# Patient Record
Sex: Male | Born: 1980
Health system: Southern US, Community
[De-identification: ages and names within clinical notes are randomized; demographics above are authoritative.]

## PROBLEM LIST (undated history)

## (undated) DIAGNOSIS — T7840XA Allergy, unspecified, initial encounter: Secondary | ICD-10-CM

## (undated) DIAGNOSIS — R55 Syncope and collapse: Secondary | ICD-10-CM

## (undated) DIAGNOSIS — J45909 Unspecified asthma, uncomplicated: Secondary | ICD-10-CM

## (undated) HISTORY — DX: Syncope and collapse: R55

## (undated) HISTORY — DX: Allergy, unspecified, initial encounter: T78.40XA

## (undated) HISTORY — DX: Unspecified asthma, uncomplicated: J45.909

## (undated) HISTORY — PX: ANKLE SURGERY: SHX546

---

## 2004-11-14 ENCOUNTER — Ambulatory Visit (HOSPITAL_COMMUNITY): Admission: RE | Admit: 2004-11-14 | Discharge: 2004-11-14 | Payer: Self-pay | Admitting: Family Medicine

## 2005-04-01 ENCOUNTER — Ambulatory Visit: Admission: RE | Admit: 2005-04-01 | Discharge: 2005-04-01 | Payer: Self-pay | Admitting: Family Medicine

## 2017-06-05 ENCOUNTER — Other Ambulatory Visit: Payer: Self-pay | Admitting: Family Medicine

## 2017-06-05 DIAGNOSIS — M25572 Pain in left ankle and joints of left foot: Secondary | ICD-10-CM

## 2017-06-10 ENCOUNTER — Other Ambulatory Visit: Payer: Self-pay

## 2017-06-18 ENCOUNTER — Ambulatory Visit
Admission: RE | Admit: 2017-06-18 | Discharge: 2017-06-18 | Disposition: A | Discharge status: Home / Self Care | Payer: Commercial Managed Care - HMO | Source: Ambulatory Visit | Attending: Family Medicine | Admitting: Family Medicine

## 2017-06-18 DIAGNOSIS — M25572 Pain in left ankle and joints of left foot: Secondary | ICD-10-CM

## 2017-09-21 ENCOUNTER — Ambulatory Visit: Payer: Commercial Managed Care - HMO | Admitting: Physical Therapy

## 2017-09-23 ENCOUNTER — Ambulatory Visit: Payer: 59 | Attending: Orthopedic Surgery | Admitting: Physical Therapy

## 2017-09-23 DIAGNOSIS — R600 Localized edema: Secondary | ICD-10-CM | POA: Diagnosis present

## 2017-09-23 DIAGNOSIS — M25672 Stiffness of left ankle, not elsewhere classified: Secondary | ICD-10-CM | POA: Diagnosis present

## 2017-09-23 DIAGNOSIS — M25572 Pain in left ankle and joints of left foot: Secondary | ICD-10-CM | POA: Diagnosis present

## 2017-09-23 NOTE — Therapy (Signed)
[facility] [address] [place], [place], [address] Phone: [phone]   Fax:  [phone]  Physical Therapy Evaluation  Patient Details  Name: James Sosa MRN: [id number] Date of Birth: 1981-11-11 Referring Provider: Alfredo MartinezJustin Ollis PA-C.  Encounter Date: 09/23/2017      PT End of Session - 09/23/17 1650    Visit Number 1   Number of Visits 12   Date for PT Re-Evaluation 10/21/17   PT Start Time 1203   PT Stop Time 1258   PT Time Calculation (min) 55 min   Activity Tolerance Patient tolerated treatment well   Behavior During Therapy [facility] for tasks assessed/performed      No past medical history on file.  No past surgical history on file.  There were no vitals filed for this visit.       Subjective Assessment - 09/23/17 1653    Subjective The patient reports that on May 30, 2017 he pased out likely due to Vasovagal syncope.  This resulted in a fall from a second story balcony and he sustained a left tibial fracture and underwent an ORIF. He is now wbat out of the boot.  His pain is a low 2/10 with some left heel pain.  His pain increases when standing too long and decreases with Aleve.               Limitations Standing   How long can you stand comfortably? 15 minutes.   Patient Stated Goals Get out of pain and walk normally.   Currently in Pain? Yes   Pain Score 2    Pain Location Ankle   Pain Orientation Left   Pain Descriptors / Indicators Aching;Dull   Pain Type Surgical pain   Pain Onset More than a month ago   Pain Frequency Intermittent   Aggravating Factors  See above.   Pain Relieving Factors See above.            [facility] - 09/23/17 0001      Assessment   Medical Diagnosis Left minimally displaced left tibial fracture.   Referring Provider Alfredo MartinezJustin Ollis PA-C.   Onset Date/Surgical Date --  05/30/17 (date of injury).     Precautions   Precautions None     Restrictions   Weight Bearing  Restrictions No     Balance Screen   Has the patient fallen in the past 6 months Yes   How many times? --  1.   Has the patient had a decrease in activity level because of a fear of falling?  No   Is the patient reluctant to leave their home because of a fear of falling?  No     Home Environment   Living Environment Private residence     Prior Function   Level of Independence Independent     Observation/Other Assessments   Observations Left anterior distal tibial incision is well healed.     Observation/Other Assessments-Edema    Edema --  Min localized edema in area of malleoli.     ROM / Strength   AROM / PROM / Strength AROM;Strength     AROM   AROM Assessment Site Ankle   Right/Left Ankle Left   Left Ankle Dorsiflexion --  3 degrees knee extended and 10 degrees knee flexed.   Left Ankle Plantar Flexion --  30 degrees.   Left Ankle Inversion --  10 degrees.   Left Ankle Eversion --  4 degrees.  Strength   Overall Strength Comments Graded only groslly at 4/5.     Palpation   Palpation comment Palpable pain over left heel and left distal tibia incisional site.     Ambulation/Gait   Gait Comments Decreased stance time over left LE.            Objective measurements completed on examination: See above findings.          [facility] Adult PT Treatment/Exercise - 09/23/17 0001      Modalities   Modalities Electrical Stimulation;Vasopneumatic     Electrical Stimulation   Electrical Stimulation Location Left foot/ankle.   Electrical Stimulation Action IFC   Electrical Stimulation Parameters 80-150 Hz x 20 minutes.   Electrical Stimulation Goals Edema;Pain     Vasopneumatic   Number Minutes Vasopneumatic  20 minutes   Vasopnuematic Location  --  Left ankle.   Vasopneumatic Pressure Medium                     PT Long Term Goals - 09/23/17 1728      PT LONG TERM GOAL #1   Title Independent with a HEP.   Time 4   Period Weeks    Status New     PT LONG TERM GOAL #2   Title Increase left ankle dorsiflexion to 8 degrees to normalize the patient's gait pattern.   Time 4   Period Weeks   Status New     PT LONG TERM GOAL #3   Title Increase left ankle strength to to 5/5 to increase stability for functional tasks.   Time 4   Period Weeks   Status New     PT LONG TERM GOAL #4   Title Walk a community distance with pain not > 2/10 and no deviations.   Time 4   Period Weeks   Status New                Plan - 09/23/17 1708    Clinical Impression Statement The patient presents to OPPT s/p left tibial ORIF.  He has been wbat over his left LE with normal footwear.  His pain is low but increases with increased standing.  He has some residual swelling and an expected loss of left ankle ROM and strength.  The patient's deficts impair his functional mobility but he is expected to do well with skilled physical therapy intervention.     History and Personal Factors relevant to plan of care: H/o "passing out".  Patient states it may be relate to a couple of head injuries in the past.   Clinical Presentation Stable   Clinical Presentation due to: Excellent surgicla outcome.   Clinical Decision Making Low   Rehab Potential Excellent   PT Frequency 3x / week   PT Duration 4 weeks   PT Treatment/Interventions ADLs/Self Care Home Management;Cryotherapy;Retail banker;Therapeutic activities;Therapeutic exercise;Ultrasound;Neuromuscular re-education;Patient/family education;Passive range of motion;Manual techniques;Vasopneumatic Device   PT Next Visit Plan Towel stretch to increase left ankle dorsiflexion; seated progressing to standing Rockerboard; PROM to increase all left ankle motion; Theraband exercises; progression to ankle isolator and dynadisc then to proproceptive drills.  E'stim and vasopneumatic.   Consulted and Agree with Plan of Care Patient      Patient will benefit from  skilled therapeutic intervention in order to improve the following deficits and impairments:  Abnormal gait, Decreased activity tolerance, Pain, Decreased range of motion, Increased edema, Decreased strength, Decreased mobility  Visit Diagnosis: Pain in left ankle  and joints of left foot - Plan: PT plan of care cert/re-cert  Stiffness of left ankle, not elsewhere classified - Plan: PT plan of care cert/re-cert  Localized edema - Plan: PT plan of care cert/re-cert     Problem List There are no active problems to display for this patient.   James Sosa, [place] MPT 09/23/2017, 5:32 PM  [facility] [address] [place], [place], [address] Phone: [phone]   Fax:  [phone]  Name: James Sosa MRN: [id number] Date of Birth: Jul 09, 1981

## 2017-09-30 ENCOUNTER — Ambulatory Visit: Payer: 59 | Attending: Orthopedic Surgery | Admitting: Physical Therapy

## 2017-09-30 ENCOUNTER — Encounter: Payer: Self-pay | Admitting: Physical Therapy

## 2017-09-30 DIAGNOSIS — R600 Localized edema: Secondary | ICD-10-CM | POA: Diagnosis present

## 2017-09-30 DIAGNOSIS — M25672 Stiffness of left ankle, not elsewhere classified: Secondary | ICD-10-CM | POA: Insufficient documentation

## 2017-09-30 DIAGNOSIS — M25572 Pain in left ankle and joints of left foot: Secondary | ICD-10-CM | POA: Diagnosis not present

## 2017-09-30 NOTE — Therapy (Signed)
[facility] [address] [place], [place], [address] Phone: [phone]   Fax:  [phone]  Physical Therapy Treatment  Patient Details  Name: James RelicJeremy H Mccollum MRN: [id number] Date of Birth: 08/17/1981 Referring Provider: Alfredo MartinezJustin Ollis PA-C.   Encounter Date: 09/30/2017  PT End of Session - 09/30/17 1425    Visit Number  2    Number of Visits  12    Date for PT Re-Evaluation  10/21/17    PT Start Time  0945    PT Stop Time  1042    PT Time Calculation (min)  57 min       History reviewed. No pertinent past medical history.  History reviewed. No pertinent surgical history.  There were no vitals filed for this visit.  Subjective Assessment - 09/30/17 1431    Subjective  No new complaints.    Pain Score  2     Pain Location  Ankle    Pain Orientation  Left    Pain Descriptors / Indicators  Aching;Dull    Pain Type  Surgical pain    Pain Onset  More than a month ago                      [facility]/Exercise - 09/30/17 0001      Exercises   Exercises  Knee/Hip;Ankle      Knee/Hip Exercises: Aerobic   Nustep  Level 3 x 16 minutes.      Programme researcher, broadcasting/film/videolectrical Stimulation   Electrical Stimulation Location  Left foot/ankle.    Electrical Stimulation Action  IFC    Electrical Stimulation Parameters  80-150 Hz x 20 minutes.      Vasopneumatic   Number Minutes Vasopneumatic   20 minutes    Vasopnuematic Location   -- Left ankle.   Left ankle.   Vasopneumatic Pressure  Medium      Ankle Exercises: Standing   Rocker Board Limitations  5 minutes in parallel bars x 5 minutes.    Other Standing Ankle Exercises  Dynadisc x 5 minutes into dorsi/plantarflexion                  PT Long Term Goals - 09/23/17 1728      PT LONG TERM GOAL #1   Title  Independent with a HEP.    Time  4    Period  Weeks    Status  New      PT LONG TERM GOAL #2   Title  Increase left ankle dorsiflexion to 8 degrees to  normalize the patient's gait pattern.    Time  4    Period  Weeks    Status  New      PT LONG TERM GOAL #3   Title  Increase left ankle strength to to 5/5 to increase stability for functional tasks.    Time  4    Period  Weeks    Status  New      PT LONG TERM GOAL #4   Title  Walk a community distance with pain not > 2/10 and no deviations.    Time  4    Period  Weeks    Status  New            Plan - 09/30/17 1455    Clinical Impression Statement  Excellent job today.  No complaints with ther ex.    PT Next Visit Plan  Towel stretch to increase left ankle  dorsiflexion; seated progressing to standing Rockerboard; PROM to increase all left ankle motion; Theraband exercises; progression to ankle isolator and dynadisc then to proproceptive drills.  E'stim and vasopneumatic.       Patient will benefit from skilled therapeutic intervention in order to improve the following deficits and impairments:  Abnormal gait, Decreased activity tolerance, Pain, Decreased range of motion, Increased edema, Decreased strength, Decreased mobility  Visit Diagnosis: Pain in left ankle and joints of left foot  Stiffness of left ankle, not elsewhere classified  Localized edema     Problem List There are no active problems to display for this patient.   Chaz Ronning, [place]  MPT 09/30/2017, 3:02 PM  [facility] [address] [place], [place], [address] Phone: [phone]   Fax:  [phone]  Name: James RelicJeremy H Wooley MRN: [id number] Date of Birth: 10-24-1981

## 2017-10-02 ENCOUNTER — Ambulatory Visit: Payer: 59 | Admitting: Physical Therapy

## 2017-10-02 ENCOUNTER — Encounter: Payer: Self-pay | Admitting: Physical Therapy

## 2017-10-02 DIAGNOSIS — M25572 Pain in left ankle and joints of left foot: Secondary | ICD-10-CM | POA: Diagnosis not present

## 2017-10-02 DIAGNOSIS — M25672 Stiffness of left ankle, not elsewhere classified: Secondary | ICD-10-CM

## 2017-10-02 DIAGNOSIS — R600 Localized edema: Secondary | ICD-10-CM

## 2017-10-02 NOTE — Therapy (Signed)
[facility] [address] [place], [place], [address] Phone: [phone]   Fax:  [phone]  Physical Therapy Treatment  Patient Details  Name: James Sosa MRN: [id number] Date of Birth: 11-Jun-1981 Referring Provider: Alfredo MartinezJustin Ollis PA-C.   Encounter Date: 10/02/2017  PT End of Session - 10/02/17 0954    Visit Number  3    Number of Visits  12    Date for PT Re-Evaluation  10/21/17    PT Start Time  0951    PT Stop Time  1042    PT Time Calculation (min)  51 min    Activity Tolerance  Patient tolerated treatment well    Behavior During Therapy  [facility] for tasks assessed/performed       History reviewed. No pertinent past medical history.  History reviewed. No pertinent surgical history.  There were no vitals filed for this visit.  Subjective Assessment - 10/02/17 0953    Subjective  Reports pain at this time is nothing that tylenol can't fix.    Limitations  Standing    How long can you stand comfortably?  15 minutes.    Patient Stated Goals  Get out of pain and walk normally.    Currently in Pain?  No/denies         [facility] - 10/02/17 0001      Assessment   Medical Diagnosis  Left minimally displaced left tibial fracture.    Onset Date/Surgical Date  05/30/17 DOI    Next MD Visit  10/09/2017      Precautions   Precautions  None      Restrictions   Weight Bearing Restrictions  No                  [facility] Adult PT Treatment/Exercise - 10/02/17 0001      Knee/Hip Exercises: Aerobic   Stationary Bike  L1 x10 min      Knee/Hip Exercises: Standing   Heel Raises  Both;20 reps B toe raise x20 reps    Rocker Board  3 minutes x3 min for stretch, x3 min for balance    Other Standing Knee Exercises  L ankle dynadisc PF/DF, Inv/Ev, circles       Modalities   Modalities  Electrical Stimulation;Vasopneumatic      Electrical Stimulation   Electrical Stimulation Location  L ankle    Electrical  Stimulation Action  Pre-Mod    Electrical Stimulation Parameters  80-150 hz x15 min    Electrical Stimulation Goals  Edema;Pain      Vasopneumatic   Number Minutes Vasopneumatic   15 minutes    Vasopnuematic Location   Knee    Vasopneumatic Pressure  Medium    Vasopneumatic Temperature   53      Ankle Exercises: Seated   BAPS  Sitting;Level 2;15 reps PF/DF, Inv/Ev    Other Seated Ankle Exercises  L ankle dynadisc 1# DF/Inv/Ev x20 reps each                  PT Long Term Goals - 09/23/17 1728      PT LONG TERM GOAL #1   Title  Independent with a HEP.    Time  4    Period  Weeks    Status  New      PT LONG TERM GOAL #2   Title  Increase left ankle dorsiflexion to 8 degrees to normalize the patient's gait pattern.    Time  4  Period  Weeks    Status  New      PT LONG TERM GOAL #3   Title  Increase left ankle strength to to 5/5 to increase stability for functional tasks.    Time  4    Period  Weeks    Status  New      PT LONG TERM GOAL #4   Title  Walk a community distance with pain not > 2/10 and no deviations.    Time  4    Period  Weeks    Status  New            Plan - 10/02/17 1042    Clinical Impression Statement  Patient tolerated today's treatment well as he arrived with no L ankle pain and had no complaints of L ankle pain during exercises. Patient experienced discomfort with L ankle inversion today per patient report. Patient lacked full L ankle control with seated BAPS board today in all directions assessed. Minimal edema present surrounding lateral L malleoli upon obesrvation. Normal modalities response noted following removal of the modalities.    Rehab Potential  Excellent    PT Frequency  3x / week    PT Duration  4 weeks    PT Treatment/Interventions  ADLs/Self Care Home Management;Cryotherapy;Retail bankerlectrical Stimulation;Gait training;Stair training;Therapeutic activities;Therapeutic exercise;Ultrasound;Neuromuscular re-education;Patient/family  education;Passive range of motion;Manual techniques;Vasopneumatic Device    PT Next Visit Plan  Continue with ankle strengthening as well as control exercises per MPT POC.    Consulted and Agree with Plan of Care  Patient       Patient will benefit from skilled therapeutic intervention in order to improve the following deficits and impairments:  Abnormal gait, Decreased activity tolerance, Pain, Decreased range of motion, Increased edema, Decreased strength, Decreased mobility  Visit Diagnosis: Pain in left ankle and joints of left foot  Stiffness of left ankle, not elsewhere classified  Localized edema     Problem List There are no active problems to display for this patient.   Evelene CroonKelsey M Parsons, PTA 10/02/2017, 10:51 AM  [facility] [address] [place], [place], [address] Phone: [phone]   Fax:  [phone]  Name: James Sosa MRN: [id number] Date of Birth: Dec 07, 1980

## 2017-10-07 ENCOUNTER — Encounter: Payer: Self-pay | Admitting: Physical Therapy

## 2017-10-07 ENCOUNTER — Ambulatory Visit: Payer: 59 | Admitting: Physical Therapy

## 2017-10-07 DIAGNOSIS — M25572 Pain in left ankle and joints of left foot: Secondary | ICD-10-CM

## 2017-10-07 DIAGNOSIS — M25672 Stiffness of left ankle, not elsewhere classified: Secondary | ICD-10-CM

## 2017-10-07 DIAGNOSIS — R600 Localized edema: Secondary | ICD-10-CM

## 2017-10-07 NOTE — Therapy (Signed)
[facility] [address] [place], [place], [address] Phone: [phone]   Fax:  [phone]  Physical Therapy Treatment  Patient Details  Name: James Sosa MRN: [id number] Date of Birth: 1980/12/08 Referring Provider: Alfredo MartinezJustin Ollis PA-C.   Encounter Date: 10/07/2017  PT End of Session - 10/07/17 1037    Visit Number  4    Number of Visits  12    Date for PT Re-Evaluation  10/21/17    PT Start Time  0951    PT Stop Time  1045    PT Time Calculation (min)  54 min    Activity Tolerance  Patient tolerated treatment well    Behavior During Therapy  [facility] for tasks assessed/performed       History reviewed. No pertinent past medical history.  History reviewed. No pertinent surgical history.  There were no vitals filed for this visit.  Subjective Assessment - 10/07/17 0953    Subjective  No complaints upon arrival    Limitations  Standing    How long can you stand comfortably?  15 minutes.    Patient Stated Goals  Get out of pain and walk normally.    Currently in Pain?  No/denies         [facility] - 10/07/17 0001      AROM   AROM Assessment Site  Ankle    Right/Left Ankle  Left    Left Ankle Dorsiflexion  5                  [facility] Adult PT Treatment/Exercise - 10/07/17 0001      Knee/Hip Exercises: Aerobic   Stationary Bike  L1 x14 min      Knee/Hip Exercises: Standing   Heel Raises  Both;20 reps    Rocker Board  Other (comment) 3min each balance/stretch      Programme researcher, broadcasting/film/videolectrical Stimulation   Electrical Stimulation Location  L ankle    Electrical Stimulation Action  premod    Electrical Stimulation Parameters  1-10hz  x7315min    Electrical Stimulation Goals  Edema;Pain      Vasopneumatic   Number Minutes Vasopneumatic   15 minutes    Vasopnuematic Location   Knee    Vasopneumatic Pressure  Medium      Ankle Exercises: Seated   Other Seated Ankle Exercises  L ankle dynadisc 1# DF (seaed) Inv/Ev  (sidelying) x20 reps each    Other Seated Ankle Exercises  3min seated prostretch      Ankle Exercises: Supine   T-Band  yellow x20 each way                  PT Long Term Goals - 10/07/17 0954      PT LONG TERM GOAL #1   Title  Independent with a HEP.    Time  4    Period  Weeks    Status  On-going      PT LONG TERM GOAL #2   Title  Increase left ankle dorsiflexion to 8 degrees to normalize the patient's gait pattern.    Time  4    Period  Weeks    Status  On-going      PT LONG TERM GOAL #3   Title  Increase left ankle strength to to 5/5 to increase stability for functional tasks.    Time  4    Period  Weeks    Status  On-going      PT LONG TERM  GOAL #4   Title  Walk a community distance with pain not > 2/10 and no deviations.    Time  4    Period  Weeks    Status  On-going            Plan - 10/07/17 1040    Clinical Impression Statement  Patient tolerated treatment well today. Patient has reported little to no discomfort and continues to wear compression sock to help with swelling. Patient able to progress with exercises today with no difficulty. Patient has improved AROM for DF today. Progressing toward goals.     Rehab Potential  Excellent    PT Frequency  3x / week    PT Duration  4 weeks    PT Treatment/Interventions  ADLs/Self Care Home Management;Cryotherapy;Retail bankerlectrical Stimulation;Gait training;Stair training;Therapeutic activities;Therapeutic exercise;Ultrasound;Neuromuscular re-education;Patient/family education;Passive range of motion;Manual techniques;Vasopneumatic Device    PT Next Visit Plan  Continue with ankle strengthening as well as control exercises per MPT POC. MD. note Victorino Dike(Hewitt) next treatment and issue HEP    Consulted and Agree with Plan of Care  Patient       Patient will benefit from skilled therapeutic intervention in order to improve the following deficits and impairments:  Abnormal gait, Decreased activity tolerance, Pain,  Decreased range of motion, Increased edema, Decreased strength, Decreased mobility  Visit Diagnosis: Pain in left ankle and joints of left foot  Stiffness of left ankle, not elsewhere classified  Localized edema     Problem List There are no active problems to display for this patient.   Hermelinda DellenDUNFORD, Miklo Aken P, PTA 10/07/2017, 10:53 AM  [facility] [address] [place], [place], [address] Phone: [phone]   Fax:  [phone]  Name: James Sosa MRN: [id number] Date of Birth: 1981-06-27

## 2017-10-08 ENCOUNTER — Encounter: Payer: Self-pay | Admitting: Physical Therapy

## 2017-10-08 ENCOUNTER — Ambulatory Visit: Payer: 59 | Admitting: Physical Therapy

## 2017-10-08 DIAGNOSIS — M25572 Pain in left ankle and joints of left foot: Secondary | ICD-10-CM | POA: Diagnosis not present

## 2017-10-08 DIAGNOSIS — M25672 Stiffness of left ankle, not elsewhere classified: Secondary | ICD-10-CM

## 2017-10-08 DIAGNOSIS — R600 Localized edema: Secondary | ICD-10-CM

## 2017-10-08 NOTE — Therapy (Signed)
[facility] [address] [place], [place], [address] Phone: [phone]   Fax:  [phone]  Physical Therapy Treatment  Patient Details  Name: James Sosa MRN: [id number] Date of Birth: 21-Apr-1981 Referring Provider: Alfredo MartinezJustin Ollis PA-C.   Encounter Date: 10/08/2017  PT End of Session - 10/08/17 1115    Visit Number  5    Number of Visits  12    Date for PT Re-Evaluation  10/21/17    PT Start Time  1030    PT Stop Time  1124    PT Time Calculation (min)  54 min    Activity Tolerance  Patient tolerated treatment well    Behavior During Therapy  [facility] for tasks assessed/performed       History reviewed. No pertinent past medical history.  History reviewed. No pertinent surgical history.  There were no vitals filed for this visit.  Subjective Assessment - 10/08/17 1033    Subjective  Patient reported doing well overall and would like to go back to work/MD appt tomorrow    Limitations  Standing    How long can you stand comfortably?  15 minutes.    Patient Stated Goals  Get out of pain and walk normally.    Currently in Pain?  No/denies         [facility] - 10/08/17 0001      AROM   AROM Assessment Site  Ankle    Right/Left Ankle  Left    Left Ankle Dorsiflexion  5                  [facility] Adult PT Treatment/Exercise - 10/08/17 0001      Knee/Hip Exercises: Aerobic   Stationary Bike  x11 min L5      Knee/Hip Exercises: Standing   Heel Raises  Both;20 reps    Rocker Board  Other (comment) balance/stretch      Knee/Hip Exercises: Supine   Straight Leg Raises  Strengthening;Left;3 sets;10 reps      Knee/Hip Exercises: Sidelying   Hip ABduction  Strengthening;Left;20 reps      Electrical Stimulation   Electrical Stimulation Location  L ankle    Electrical Stimulation Action  premod    Electrical Stimulation Parameters  1-10hz  x115min    Electrical Stimulation Goals  Edema;Pain      Vasopneumatic   Number Minutes Vasopneumatic   15 minutes    Vasopnuematic Location   Knee    Vasopneumatic Pressure  Medium      Ankle Exercises: Seated   Other Seated Ankle Exercises  L ankle dynadisc 1# DF (seated) Inv/Ev (sidelying) x30 reps each    Other Seated Ankle Exercises  3min seated prostretch      Ankle Exercises: Supine   T-Band  red x20 each way             PT Education - 10/08/17 1041    Education provided  Yes    Education Details  HEP    Person(s) Educated  Patient    Methods  Explanation;Demonstration;Handout    Comprehension  Verbalized understanding;Returned demonstration          PT Long Term Goals - 10/07/17 0954      PT LONG TERM GOAL #1   Title  Independent with a HEP.    Time  4    Period  Weeks    Status  On-going      PT LONG TERM GOAL #2   Title  Increase left ankle dorsiflexion to 8 degrees to normalize the patient's gait pattern.    Time  4    Period  Weeks    Status  On-going      PT LONG TERM GOAL #3   Title  Increase left ankle strength to to 5/5 to increase stability for functional tasks.    Time  4    Period  Weeks    Status  On-going      PT LONG TERM GOAL #4   Title  Walk a community distance with pain not > 2/10 and no deviations.    Time  4    Period  Weeks    Status  On-going            Plan - 10/08/17 1115    Clinical Impression Statement  Patient tolerated treatment well today. Patient reported little to no discomfort overall and continues to wear compression sock to help with edema. Patient progressing with exercises today. Patient improved with AROM DF to 5 degrees. Goals progressing.     Rehab Potential  Excellent    PT Frequency  3x / week    PT Duration  4 weeks    PT Treatment/Interventions  ADLs/Self Care Home Management;Cryotherapy;Retail bankerlectrical Stimulation;Gait training;Stair training;Therapeutic activities;Therapeutic exercise;Ultrasound;Neuromuscular re-education;Patient/family education;Passive  range of motion;Manual techniques;Vasopneumatic Device    PT Next Visit Plan  Continue with ankle strengthening as well as control exercises per MPT POC. MD. note sent (Hewitt)     Consulted and Agree with Plan of Care  Patient       Patient will benefit from skilled therapeutic intervention in order to improve the following deficits and impairments:  Abnormal gait, Decreased activity tolerance, Pain, Decreased range of motion, Increased edema, Decreased strength, Decreased mobility  Visit Diagnosis: Pain in left ankle and joints of left foot  Stiffness of left ankle, not elsewhere classified  Localized edema     Problem List There are no active problems to display for this patient.   Cathie HoopsChristina Anahi Belmar, PTA 10/08/17 11:56 AM  [facility] [address] [place], [place], [address] Phone: [phone]   Fax:  [phone]  Name: James Sosa MRN: [id number] Date of Birth: 06/03/1981

## 2017-10-08 NOTE — Patient Instructions (Signed)
  Dorsiflexion: Resisted   Facing anchor, tubing around left foot, pull toward face.  Repeat _10___ times per set. Do __2__ sets per session. Do _2___ sessions per day.   Plantar Flexion: Resisted   Anchor behind, tubing around left foot, press down. Repeat __10__ times per set. Do __2__ sets per session. Do ___2_ sessions per day.   Inversion: Resisted   Cross legs with right leg underneath, foot in tubing loop. Hold tubing around other foot to resist and turn foot in. Repeat _10___ times per set. Do __2__ sets per session. Do _2___ sessions per day.   Eversion: Resisted   With right foot in tubing loop, hold tubing around other foot to resist and turn foot out. Repeat _10___ times per set. Do __2__ sets per session. Do __2__ sessions per day.    

## 2017-10-09 ENCOUNTER — Encounter: Payer: 59 | Admitting: *Deleted

## 2017-10-13 ENCOUNTER — Ambulatory Visit: Payer: 59 | Admitting: *Deleted

## 2017-10-13 DIAGNOSIS — M25672 Stiffness of left ankle, not elsewhere classified: Secondary | ICD-10-CM

## 2017-10-13 DIAGNOSIS — M25572 Pain in left ankle and joints of left foot: Secondary | ICD-10-CM | POA: Diagnosis not present

## 2017-10-13 NOTE — Therapy (Signed)
[facility] [address] [place], [place], [address] Phone: [phone]   Fax:  [phone]  Physical Therapy Treatment  Patient Details  Name: James RelicJeremy H Sosa MRN: [id number] Date of Birth: May 28, 1981 Referring Provider: Alfredo MartinezJustin Ollis PA-C.   Encounter Date: 10/13/2017  PT End of Session - 10/13/17 1000    Visit Number  6    Number of Visits  12    Date for PT Re-Evaluation  10/21/17    PT Start Time  0945    PT Stop Time  1044    PT Time Calculation (min)  59 min       No past medical history on file.  No past surgical history on file.  There were no vitals filed for this visit.  Subjective Assessment - 10/13/17 0958    Subjective  MD F/U went well. Released for work. Went BTW yesterday    Limitations  Standing    How long can you stand comfortably?  15 minutes.    Patient Stated Goals  Get out of pain and walk normally.    Currently in Pain?  No/denies    Pain Location  Ankle    Pain Orientation  Left    Pain Descriptors / Indicators  Sore    Pain Onset  More than a month ago    Pain Frequency  Intermittent                      [facility] Adult PT Treatment/Exercise - 10/13/17 0001      Knee/Hip Exercises: Aerobic   Stationary Bike  x15  min L5      Knee/Hip Exercises: Standing   Heel Raises  Both;20 reps    Rocker Board  Other (comment);5 minutes balance/stretch    SLS  LT LE balance in bars  5-8 secs max without assist.      Insurance claims handlerlectrical Stimulation   Electrical Stimulation Location  L ankle  premod 1-10hz  x 15 mins    Electrical Stimulation Goals  Edema;Pain      Vasopneumatic   Number Minutes Vasopneumatic   15 minutes    Vasopnuematic Location   Knee    Vasopneumatic Pressure  Medium    Vasopneumatic Temperature   36      Ankle Exercises: Standing   Other Standing Ankle Exercises  Dynadisc x 5 minutes into dorsi/plantarflexion, circles                  PT Long Term Goals -  10/07/17 0954      PT LONG TERM GOAL #1   Title  Independent with a HEP.    Time  4    Period  Weeks    Status  On-going      PT LONG TERM GOAL #2   Title  Increase left ankle dorsiflexion to 8 degrees to normalize the patient's gait pattern.    Time  4    Period  Weeks    Status  On-going      PT LONG TERM GOAL #3   Title  Increase left ankle strength to to 5/5 to increase stability for functional tasks.    Time  4    Period  Weeks    Status  On-going      PT LONG TERM GOAL #4   Title  Walk a community distance with pain not > 2/10 and no deviations.    Time  4    Period  Weeks  Status  On-going            Plan - 10/13/17 1101    Clinical Impression Statement  Pt did well with PT Rx and continues to progress. SLS was the most challenging and could only be held 5-8 secs without assistance.    PT Treatment/Interventions  Other (comment)    PT Next Visit Plan  Continue with ankle strengthening as well as control exercises per MPT POC. MD. note sent (Hewitt)     Consulted and Agree with Plan of Care  Patient       Patient will benefit from skilled therapeutic intervention in order to improve the following deficits and impairments:     Visit Diagnosis: Pain in left ankle and joints of left foot  Stiffness of left ankle, not elsewhere classified     Problem List There are no active problems to display for this patient.   James Sosa,CHRIS, PTA 10/13/2017, 6:11 PM  [facility] [address] [place], [place], [address] Phone: [phone]   Fax:  [phone]  Name: James RelicJeremy H Sosa MRN: [id number] Date of Birth: [birth year]

## 2017-10-20 ENCOUNTER — Encounter: Payer: Self-pay | Admitting: Physical Therapy

## 2017-10-20 ENCOUNTER — Ambulatory Visit: Payer: 59 | Admitting: Physical Therapy

## 2017-10-20 DIAGNOSIS — M25572 Pain in left ankle and joints of left foot: Secondary | ICD-10-CM | POA: Diagnosis not present

## 2017-10-20 DIAGNOSIS — R600 Localized edema: Secondary | ICD-10-CM

## 2017-10-20 DIAGNOSIS — M25672 Stiffness of left ankle, not elsewhere classified: Secondary | ICD-10-CM

## 2017-10-20 NOTE — Therapy (Signed)
[facility] [address] [place], [place], [address] Phone: [phone]   Fax:  [phone]  Physical Therapy Treatment  Patient Details  Name: James RelicJeremy H Sosa MRN: [id number] Date of Birth: Feb 01, 1981 Referring Provider: Alfredo MartinezJustin Ollis PA-C.   Encounter Date: 10/20/2017  PT End of Session - 10/20/17 0950    Visit Number  7    Number of Visits  12    Date for PT Re-Evaluation  10/21/17    PT Start Time  0949    PT Stop Time  1044    PT Time Calculation (min)  55 min    Activity Tolerance  Patient tolerated treatment well    Behavior During Therapy  [facility] for tasks assessed/performed       History reviewed. No pertinent past medical history.  History reviewed. No pertinent surgical history.  There were no vitals filed for this visit.  Subjective Assessment - 10/20/17 0950    Subjective  Reports that he has returned to work and has some soreness as he has to stand on concrete floors and does quite a bit of standing at work per patient report.    Limitations  Standing    How long can you stand comfortably?  6-7 hours    How long can you walk comfortably?  unlimited with flat surfaces    Patient Stated Goals  Get out of pain and walk normally.    Currently in Pain?  No/denies         [facility] - 10/20/17 0001      Assessment   Medical Diagnosis  Left minimally displaced left tibial fracture.    Onset Date/Surgical Date  05/30/17    Next MD Visit  None      Precautions   Precautions  None      Restrictions   Weight Bearing Restrictions  No                  [facility] Adult PT Treatment/Exercise - 10/20/17 0001      Knee/Hip Exercises: Aerobic   Stationary Bike  L3 x12 min      Modalities   Modalities  Programmer, applicationslectrical Stimulation;Vasopneumatic      Electrical Stimulation   Electrical Stimulation Location  L ankle    Electrical Stimulation Action  Pre-Mod    Electrical Stimulation Parameters  80-150 hz x15  min    Electrical Stimulation Goals  Pain      Vasopneumatic   Number Minutes Vasopneumatic   15 minutes    Vasopnuematic Location   Knee    Vasopneumatic Pressure  Medium    Vasopneumatic Temperature   66      Ankle Exercises: Standing   Rocker Board  3 minutes    Heel Raises  20 reps 3D off 2" step    Toe Raise  20 reps      Ankle Exercises: Sidelying   Ankle Inversion  Strengthening;Left;20 reps;Weights    Ankle Inversion Weights (lbs)  1    Ankle Eversion  Strengthening;Left;20 reps;Weights    Ankle Eversion Weights (lbs)  1      Ankle Exercises: Stretches   Soleus Stretch  3 reps;30 seconds          Balance Exercises - 10/20/17 1018      Balance Exercises: Standing   Standing Eyes Opened  Narrow base of support (BOS) BOSU DLS x2 min    Tandem Stance  Eyes open;Foam/compliant surface;Intermittent upper extremity support on beam x3 min  SLS  Eyes open;Foam/compliant surface;Intermittent upper extremity support x4 min             PT Long Term Goals - 10/20/17 1001      PT LONG TERM GOAL #1   Title  Independent with a HEP.    Time  4    Period  Weeks    Status  Achieved      PT LONG TERM GOAL #2   Title  Increase left ankle dorsiflexion to 8 degrees to normalize the patient's gait pattern.    Time  4    Period  Weeks    Status  On-going      PT LONG TERM GOAL #3   Title  Increase left ankle strength to to 5/5 to increase stability for functional tasks.    Time  4    Period  Weeks    Status  On-going      PT LONG TERM GOAL #4   Title  Walk a community distance with pain not > 2/10 and no deviations.    Time  4    Period  Weeks    Status  Achieved            Plan - 10/20/17 1041    Clinical Impression Statement  Patient tolerated today's treatment well although with SLS he reported calf fatigue. Patient able to tolerate progressive strengthening and balance activities today without complaint of pain. Patient able to achieve HEP and pain  with ambulation goal. Normal modalities response noted following removal of the modalities.    Rehab Potential  Excellent    PT Frequency  3x / week    PT Duration  4 weeks    PT Treatment/Interventions  Other (comment)    PT Next Visit Plan  Continue with ankle strengthening as well as control exercises per MPT POC. MD. note sent (Hewitt)     Consulted and Agree with Plan of Care  Patient       Patient will benefit from skilled therapeutic intervention in order to improve the following deficits and impairments:  Abnormal gait, Decreased activity tolerance, Pain, Decreased range of motion, Increased edema, Decreased strength, Decreased mobility  Visit Diagnosis: Pain in left ankle and joints of left foot  Stiffness of left ankle, not elsewhere classified  Localized edema     Problem List There are no active problems to display for this patient.   Marvell FullerKelsey P Kennon, PTA 10/20/2017, 11:01 AM  [facility] [address] [place], [place], [address] Phone: [phone]   Fax:  [phone]  Name: James RelicJeremy H Sosa MRN: [id number] Date of Birth: 02-13-1981

## 2017-10-22 ENCOUNTER — Ambulatory Visit: Payer: 59 | Admitting: Physical Therapy

## 2017-10-22 ENCOUNTER — Encounter: Payer: Self-pay | Admitting: Physical Therapy

## 2017-10-22 DIAGNOSIS — M25572 Pain in left ankle and joints of left foot: Secondary | ICD-10-CM

## 2017-10-22 DIAGNOSIS — M25672 Stiffness of left ankle, not elsewhere classified: Secondary | ICD-10-CM

## 2017-10-22 DIAGNOSIS — R600 Localized edema: Secondary | ICD-10-CM

## 2017-10-22 NOTE — Therapy (Signed)
[facility] [address] [place], [place], [address] Phone: [phone]   Fax:  [phone]  Physical Therapy Treatment  Patient Details  Name: James RelicJeremy H Hedglin MRN: [id number] Date of Birth: [birth year] Referring Provider: Alfredo MartinezJustin Ollis PA-C.   Encounter Date: 10/22/2017  PT End of Session - 10/22/17 0907    Visit Number  8    Number of Visits  12    Date for PT Re-Evaluation  10/21/17    PT Start Time  0904    PT Stop Time  1002    PT Time Calculation (min)  58 min    Activity Tolerance  Patient tolerated treatment well    Behavior During Therapy  [facility] for tasks assessed/performed       History reviewed. No pertinent past medical history.  History reviewed. No pertinent surgical history.  There were no vitals filed for this visit.  Subjective Assessment - 10/22/17 0907    Subjective  Reports calf soreness from previous treatment but no pain.    Limitations  Standing    How long can you stand comfortably?  6-7 hours    How long can you walk comfortably?  unlimited with flat surfaces    Patient Stated Goals  Get out of pain and walk normally.    Currently in Pain?  No/denies         [facility] - 10/22/17 0001      Assessment   Medical Diagnosis  Left minimally displaced left tibial fracture.    Onset Date/Surgical Date  05/30/17    Next MD Visit  None      Precautions   Precautions  None      Restrictions   Weight Bearing Restrictions  No                  [facility] Adult PT Treatment/Exercise - 10/22/17 0001      Ankle Exercises: Aerobic   Stationary Bike  L4 x14 min      Ankle Exercises: Stretches   Soleus Stretch  3 reps;20 seconds    Slant Board Stretch  3 reps;30 seconds      Ankle Exercises: Standing   Heel Raises  20 reps 3D on floor    Toe Raise  20 reps      Ankle Exercises: Sidelying   Ankle Inversion  Strengthening;Left;20 reps;Weights    Ankle Inversion Weights (lbs)  1    Ankle  Eversion  Strengthening;Left;20 reps;Weights    Ankle Eversion Weights (lbs)  1          Balance Exercises - 10/22/17 0932      Balance Exercises: Standing   Standing Eyes Opened  Narrow base of support (BOS);Foam/compliant surface x4 min    SLS  Eyes open;Foam/compliant surface;Solid surface;Intermittent upper extremity support x2 min SLS with balance pod touches; x3 m airex             PT Long Term Goals - 10/20/17 1001      PT LONG TERM GOAL #1   Title  Independent with a HEP.    Time  4    Period  Weeks    Status  Achieved      PT LONG TERM GOAL #2   Title  Increase left ankle dorsiflexion to 8 degrees to normalize the patient's gait pattern.    Time  4    Period  Weeks    Status  On-going      PT LONG TERM  GOAL #3   Title  Increase left ankle strength to to 5/5 to increase stability for functional tasks.    Time  4    Period  Weeks    Status  On-going      PT LONG TERM GOAL #4   Title  Walk a community distance with pain not > 2/10 and no deviations.    Time  4    Period  Weeks    Status  Achieved            Plan - 10/22/17 1009    Clinical Impression Statement  Patient tolerated today's treatment well with only reports of calf soreness. Patient able to complete more exercises in standing and SL for antigravity strengthening without complaint. 3D heel raises completed on floor secondary to soreness. SLS and uneven surface balance techniques completed again today with minimal instability and only intermitant UE support. Normal modalities response noted following removal of the modalities.    Rehab Potential  Excellent    PT Frequency  3x / week    PT Duration  4 weeks    PT Treatment/Interventions  Other (comment)    PT Next Visit Plan  Continue with ankle strengthening as well as control exercises per MPT POC. MD. note sent (Hewitt)     Consulted and Agree with Plan of Care  Patient       Patient will benefit from skilled therapeutic intervention  in order to improve the following deficits and impairments:  Abnormal gait, Decreased activity tolerance, Pain, Decreased range of motion, Increased edema, Decreased strength, Decreased mobility  Visit Diagnosis: Pain in left ankle and joints of left foot  Stiffness of left ankle, not elsewhere classified  Localized edema     Problem List There are no active problems to display for this patient.   Marvell FullerKelsey P Kennon, PTA 10/22/2017, 10:30 AM  [facility] [address] [place], [place], [address] Phone: [phone]   Fax:  [phone]  Name: James RelicJeremy H Cleland MRN: [id number] Date of Birth: Mar 07, 1981

## 2017-10-27 ENCOUNTER — Ambulatory Visit: Payer: 59 | Attending: Orthopedic Surgery | Admitting: Physical Therapy

## 2017-10-27 ENCOUNTER — Encounter: Payer: Self-pay | Admitting: Physical Therapy

## 2017-10-27 DIAGNOSIS — R600 Localized edema: Secondary | ICD-10-CM | POA: Insufficient documentation

## 2017-10-27 DIAGNOSIS — M25672 Stiffness of left ankle, not elsewhere classified: Secondary | ICD-10-CM | POA: Insufficient documentation

## 2017-10-27 DIAGNOSIS — M25572 Pain in left ankle and joints of left foot: Secondary | ICD-10-CM | POA: Diagnosis not present

## 2017-10-27 NOTE — Therapy (Signed)
[facility] [address] [place], [place], [address] Phone: [phone]   Fax:  [phone]  Physical Therapy Treatment  Patient Details  Name: James Sosa MRN: [id number] Date of Birth: 1981/04/25 Referring Provider: Alfredo MartinezJustin Ollis PA-C.   Encounter Date: 10/27/2017  PT End of Session - 10/27/17 1112    Visit Number  9    Number of Visits  12    Date for PT Re-Evaluation  10/21/17    PT Start Time  0945    PT Stop Time  1035    PT Time Calculation (min)  50 min    Activity Tolerance  Patient tolerated treatment well    Behavior During Therapy  [facility] for tasks assessed/performed       History reviewed. No pertinent past medical history.  History reviewed. No pertinent surgical history.  There were no vitals filed for this visit.  Subjective Assessment - 10/27/17 1040    Subjective  Pt arriving to therapy reporting some soreness during working, Pt reported having to use an over the counter pain medication to assist with pain control during work.  No pain reported at present.     Limitations  Standing    How long can you stand comfortably?  6-7 hours    How long can you walk comfortably?  unlimited with flat surfaces    Patient Stated Goals  Get out of pain and walk normally.    Currently in Pain?  No/denies         [facility] - 10/27/17 0001      Assessment   Medical Diagnosis  Left minimally displaced left tibial fracture.    Onset Date/Surgical Date  05/30/17    Next MD Visit  None      Precautions   Precautions  None      Restrictions   Weight Bearing Restrictions  No                  [facility] Adult PT Treatment/Exercise - 10/27/17 0001      Knee/Hip Exercises: Aerobic   Stationary Bike  L6 x 12 minutes      Modalities   Modalities  Electrical Stimulation      Electrical Stimulation   Electrical Stimulation Location  left ankle    Electrical Stimulation Action  pre-mod    Electrical  Stimulation Parameters  80-150 Hz x 15 minutes     Electrical Stimulation Goals  Pain      Vasopneumatic   Number Minutes Vasopneumatic   15 minutes    Vasopnuematic Location   Knee    Vasopneumatic Pressure  Medium    Vasopneumatic Temperature   34      Ankle Exercises: Seated   BAPS  Level 4      Ankle Exercises: Aerobic   Stationary Bike  L4 x14 min      Ankle Exercises: Stretches   Soleus Stretch  3 reps;20 seconds    Slant Board Stretch  3 reps;30 seconds          Balance Exercises - 10/27/17 1000      Balance Exercises: Standing   Standing Eyes Opened  Narrow base of support (BOS);Foam/compliant surface x4 min    SLS  Eyes open;Foam/compliant surface;Solid surface;Intermittent upper extremity support x2 min SLS with balance pod touches; x3 m airex    Rockerboard  Anterior/posterior;30 seconds;Intermittent UE support    Balance Beam  foam beam holding tandum positoin x 16 seconds with left  foot back and > 30 seconds with R foot back    Lift / Chop Limitations  lift and chop in lunge position x 8 reps to each side    Other Standing Exercises  Standing tapping the balance pods x 3 for 1 minute each LE        PT Education - 10/27/17 1041    Education Details  discussed proprioception and how it can effect balance. Pt with mild discrepency in UE proprioception which was tested after pt stating he constantly bumps into door frames.     Person(s) Educated  Patient    Methods  Explanation    Comprehension  Verbalized understanding          PT Long Term Goals - 10/27/17 1126      PT LONG TERM GOAL #1   Title  Independent with a HEP.    Time  4    Period  Weeks    Status  Achieved      PT LONG TERM GOAL #2   Title  Increase left ankle dorsiflexion to 8 degrees to normalize the patient's gait pattern.    Time  4    Period  Weeks    Status  On-going      PT LONG TERM GOAL #3   Title  Increase left ankle strength to to 5/5 to increase stability for functional  tasks.    Time  4    Period  Weeks    Status  On-going      PT LONG TERM GOAL #4   Title  Walk a community distance with pain not > 2/10 and no deviations.    Time  4    Period  Weeks    Status  Achieved            Plan - 10/27/17 1112    Clinical Impression Statement  Patient tolerated treatment well. Pt revealing he has been bumping into door frames. Pt reporting his vision is fine. Pt's proproception was tested with about a 8 inch difference between UE's when placed in a position. Pt making great progress with L LE strength concentrating more on balance.     Rehab Potential  Excellent    PT Frequency  3x / week    PT Duration  4 weeks    PT Treatment/Interventions  Other (comment)    PT Next Visit Plan  Continue with ankle strengthening as well as control exercises per MPT POC. MD. note sent ([place]ew[place])     Consulted and Agree with Plan of Care  Patient       Patient will benefit from skilled therapeutic intervention in order to improve the following deficits and impairments:  Abnormal gait, Decreased activity tolerance, Pain, Decreased range of motion, Increased edema, Decreased strength, Decreased mobility  Visit Diagnosis: Pain in left ankle and joints of left foot  Stiffness of left ankle, not elsewhere classified  Localized edema     Problem List There are no active problems to display for this patient.   Sharmon LeydenJennifer R Martin, MPT 10/27/2017, 12:00 PM  [facility] [address] [place], [place], [address] Phone: [phone]   Fax:  [phone]  Name: James Sosa MRN: [id number] Date of Birth: [birth year]

## 2017-10-29 ENCOUNTER — Ambulatory Visit: Payer: 59 | Admitting: *Deleted

## 2017-10-29 ENCOUNTER — Encounter: Payer: Self-pay | Admitting: *Deleted

## 2017-10-29 DIAGNOSIS — M25572 Pain in left ankle and joints of left foot: Secondary | ICD-10-CM | POA: Diagnosis not present

## 2017-10-29 DIAGNOSIS — R600 Localized edema: Secondary | ICD-10-CM

## 2017-10-29 DIAGNOSIS — M25672 Stiffness of left ankle, not elsewhere classified: Secondary | ICD-10-CM

## 2017-10-29 NOTE — Therapy (Signed)
[facility] [address] [place], [place], [address] Phone: [phone]   Fax:  [phone]  Physical Therapy Treatment  Patient Details  Name: James RelicJeremy H Brakebill MRN: [id number] Date of Birth: 1981-01-28 Referring Provider: Alfredo MartinezJustin Ollis PA-C.   Encounter Date: 10/29/2017  PT End of Session - 10/29/17 0955    Visit Number  10    Number of Visits  12    Date for PT Re-Evaluation  10/21/17    PT Start Time  0952    PT Stop Time  1043    PT Time Calculation (min)  51 min       History reviewed. No pertinent past medical history.  History reviewed. No pertinent surgical history.  There were no vitals filed for this visit.                   [facility] Adult PT Treatment/Exercise - 10/29/17 0001      Knee/Hip Exercises: Aerobic   Stationary Bike  L6 x 12 minutes      Modalities   Modalities  Electrical Stimulation      Electrical Stimulation   Electrical Stimulation Location  left ankle   premod x 15 mins  80-150hz     Electrical Stimulation Goals  Pain      Vasopneumatic   Number Minutes Vasopneumatic   15 minutes    Vasopnuematic Location   Knee    Vasopneumatic Pressure  Medium    Vasopneumatic Temperature   34      Ankle Exercises: Aerobic   Stationary Bike  L4 x14 min      Ankle Exercises: Standing   SLS  on Inverted BOSU, cone pick up x 20, side stance ball/wall toss 2x20    Rocker Board  3 minutes calf  stretching     Other Standing Ankle Exercises  walking unges in hall,                   PT Long Term Goals - 10/27/17 1126      PT LONG TERM GOAL #1   Title  Independent with a HEP.    Time  4    Period  Weeks    Status  Achieved      PT LONG TERM GOAL #2   Title  Increase left ankle dorsiflexion to 8 degrees to normalize the patient's gait pattern.    Time  4    Period  Weeks    Status  On-going      PT LONG TERM GOAL #3   Title  Increase left ankle strength to to 5/5 to increase  stability for functional tasks.    Time  4    Period  Weeks    Status  On-going      PT LONG TERM GOAL #4   Title  Walk a community distance with pain not > 2/10 and no deviations.    Time  4    Period  Weeks    Status  Achieved            Plan - 10/29/17 0956    Clinical Impression Statement  Pt arrived today doing fairly well with minimal pain in LT ankle. He was able to perform all proprioception drills and strengthening with minimal complaints. Normal modality response.    Clinical Presentation  Stable    Clinical Decision Making  Low    Rehab Potential  Excellent    PT Frequency  3x / week  PT Duration  4 weeks    PT Treatment/Interventions  Other (comment)    PT Next Visit Plan  Continue with ankle strengthening as well as control exercises per MPT POC.    Consulted and Agree with Plan of Care  Patient       Patient will benefit from skilled therapeutic intervention in order to improve the following deficits and impairments:  Abnormal gait, Decreased activity tolerance, Pain, Decreased range of motion, Increased edema, Decreased strength, Decreased mobility  Visit Diagnosis: Pain in left ankle and joints of left foot  Stiffness of left ankle, not elsewhere classified  Localized edema     Problem List There are no active problems to display for this patient.   Kidada Ging,CHRIS, PTA 10/29/2017, 10:48 AM  [facility] [address] [place], [place], [address] Phone: [phone]   Fax:  [phone]  Name: James RelicJeremy H Anding MRN: [id number] Date of Birth: 1981-06-26

## 2017-11-03 ENCOUNTER — Ambulatory Visit: Payer: 59 | Admitting: Physical Therapy

## 2017-11-03 ENCOUNTER — Encounter: Payer: Self-pay | Admitting: Physical Therapy

## 2017-11-03 ENCOUNTER — Encounter: Payer: 59 | Admitting: Physical Therapy

## 2017-11-03 DIAGNOSIS — M25672 Stiffness of left ankle, not elsewhere classified: Secondary | ICD-10-CM

## 2017-11-03 DIAGNOSIS — R600 Localized edema: Secondary | ICD-10-CM

## 2017-11-03 DIAGNOSIS — M25572 Pain in left ankle and joints of left foot: Secondary | ICD-10-CM

## 2017-11-03 NOTE — Therapy (Signed)
[facility] [address] [place], [place], [address] Phone: [phone]   Fax:  [phone]  Physical Therapy Treatment  Patient Details  Name: Babette RelicJeremy H Cerra MRN: [id number] Date of Birth: Mar 26, 1981 Referring Provider: Alfredo MartinezJustin Ollis PA-C.   Encounter Date: 11/03/2017  PT End of Session - 11/03/17 1236    Visit Number  11    Number of Visits  12    Date for PT Re-Evaluation  10/21/17    PT Start Time  0951    PT Stop Time  1042    PT Time Calculation (min)  51 min    Activity Tolerance  Patient tolerated treatment well    Behavior During Therapy  [facility] for tasks assessed/performed       History reviewed. No pertinent past medical history.  History reviewed. No pertinent surgical history.  There were no vitals filed for this visit.  Subjective Assessment - 11/03/17 1255    Subjective  My ankle is feeling much better.    Pain Score  2     Pain Location  Ankle    Pain Orientation  Left    Pain Descriptors / Indicators  Sore    Pain Onset  More than a month ago                      [facility] Treatment/Exercise - 11/03/17 0001      Exercises   Exercises  Knee/Hip      Knee/Hip Exercises: Aerobic   Stationary Bike  Level 6 x 15 minutes.      Vasopneumatic   Number Minutes Vasopneumatic   20 minutes    Vasopnuematic Location   -- Left ankle.    Vasopneumatic Pressure  Medium      Ankle Exercises: Standing   SLS  --    Rocker Board  5 minutes    Other Standing Ankle Exercises  Inverted BOSU x 5 minutes for neuro re-edu.                  PT Long Term Goals - 10/27/17 1126      PT LONG TERM GOAL #1   Title  Independent with a HEP.    Time  4    Period  Weeks    Status  Achieved      PT LONG TERM GOAL #2   Title  Increase left ankle dorsiflexion to 8 degrees to normalize the patient's gait pattern.    Time  4    Period  Weeks    Status  On-going      PT LONG TERM GOAL #3   Title   Increase left ankle strength to to 5/5 to increase stability for functional tasks.    Time  4    Period  Weeks    Status  On-going      PT LONG TERM GOAL #4   Title  Walk a community distance with pain not > 2/10 and no deviations.    Time  4    Period  Weeks    Status  Achieved     Rockerboard x 5 minutes.       Plan - 11/03/17 1305    Clinical Impression Statement  Outstanding job today especially for neuro re-eduation.       Patient will benefit from skilled therapeutic intervention in order to improve the following deficits and impairments:     Visit Diagnosis: Pain in left ankle and  joints of left foot  Stiffness of left ankle, not elsewhere classified  Localized edema     Problem List There are no active problems to display for this patient.   Laurita Peron, [place] MPT 11/03/2017, 1:09 PM  [facility] [address] [place], [place], [address] Phone: [phone]   Fax:  [phone]  Name: Babette RelicJeremy H Coddington MRN: [id number] Date of Birth: October 26, 1981

## 2017-11-05 ENCOUNTER — Ambulatory Visit: Payer: 59 | Admitting: Physical Therapy

## 2017-11-05 ENCOUNTER — Encounter: Payer: Self-pay | Admitting: Physical Therapy

## 2017-11-05 DIAGNOSIS — M25572 Pain in left ankle and joints of left foot: Secondary | ICD-10-CM | POA: Diagnosis not present

## 2017-11-05 DIAGNOSIS — M25672 Stiffness of left ankle, not elsewhere classified: Secondary | ICD-10-CM

## 2017-11-05 DIAGNOSIS — R600 Localized edema: Secondary | ICD-10-CM

## 2017-11-05 NOTE — Therapy (Signed)
[facility] [place], [place], [address] Phone: [phone]   Fax:  [phone]  Physical Therapy Treatment  Patient Details  Name: James Sosa MRN: [id number] Date of Birth: 04/01/1981 Referring Provider: Mechele Claude PA-C.   Encounter Date: 11/05/2017  PT End of Session - 11/05/17 1027    Visit Number  12    Number of Visits  12    Date for PT Re-Evaluation  10/21/17    PT Start Time  0949    PT Stop Time  1040    PT Time Calculation (min)  51 min    Activity Tolerance  Patient tolerated treatment well    Behavior During Therapy  [facility] for tasks assessed/performed       History reviewed. No pertinent past medical history.  History reviewed. No pertinent surgical history.  There were no vitals filed for this visit.  Subjective Assessment - 11/05/17 0952    Subjective  Patient reported doing much better overall with little discomfort from some work activities.    Limitations  Standing    How long can you stand comfortably?  6-7 hours    How long can you walk comfortably?  unlimited with flat surfaces    Patient Stated Goals  Get out of pain and walk normally.    Currently in Pain?  No/denies                      [facility] Adult PT Treatment/Exercise - 11/05/17 0001      Knee/Hip Exercises: Aerobic   Stationary Bike  Level 6 x 15 minutes.      Knee/Hip Exercises: Standing   Lateral Step Up  20 reps;Left;Step Height: 8"    Forward Step Up  20 reps;Left;Step Height: 8"    Step Down  Left;20 reps;Step Height: 8"      Vasopneumatic   Number Minutes Vasopneumatic   15 minutes    Vasopnuematic Location   Knee    Vasopneumatic Pressure  Medium      Ankle Exercises: Standing   Rocker Board  Other (comment);4 minutes F/B x36mn then S/S x267m    Other Standing Ankle Exercises   BOSU x 4 (61m71meach side) for neuro re-edu.                  PT Long Term Goals - 11/05/17 1028      PT LONG  TERM GOAL #1   Title  Independent with a HEP.    Time  4    Period  Weeks    Status  Achieved      PT LONG TERM GOAL #2   Title  Increase left ankle dorsiflexion to 8 degrees to normalize the patient's gait pattern.    Time  4    Period  Weeks    Status  Achieved AROM 9 degrees 11/05/17      PT LONG TERM GOAL #3   Title  Increase left ankle strength to to 5/5 to increase stability for functional tasks.    Time  4    Period  Weeks    Status  Achieved 5/5 left ankle strength 11/05/17      PT LONG TERM GOAL #4   Title  Walk a community distance with pain not > 2/10 and no deviations.    Time  4    Period  Weeks    Status  Achieved  Plan - 11/05/17 1056    Clinical Impression Statement  Patient has met all current goals and ready to DC. Patient is independent with HEP work and all ADL's.     Rehab Potential  Excellent    PT Frequency  3x / week    PT Duration  4 weeks    PT Next Visit Plan  DC    Consulted and Agree with Plan of Care  Patient       Patient will benefit from skilled therapeutic intervention in order to improve the following deficits and impairments:  Abnormal gait, Decreased activity tolerance, Pain, Decreased range of motion, Increased edema, Decreased strength, Decreased mobility  Visit Diagnosis: Pain in left ankle and joints of left foot  Stiffness of left ankle, not elsewhere classified  Localized edema     Problem List There are no active problems to display for this patient.  Ladean Raya, PTA 11/05/17 10:58 AM  [facility] [address] [place], [place], [address] Phone: [phone]   Fax:  [phone]  Name: James Sosa MRN: [id number] Date of Birth: [birth year]  PHYSICAL THERAPY DISCHARGE SUMMARY  Visits from Start of Care: 12.  Current functional level related to goals / functional outcomes: See above.   Remaining deficits: All goals met.   Education /  Equipment: HEP. Plan: Patient agrees to discharge.  Patient goals were met. Patient is being discharged due to meeting the stated rehab goals.  ?????         Mali Applegate MPT

## 2019-01-20 DIAGNOSIS — S0992XA Unspecified injury of nose, initial encounter: Secondary | ICD-10-CM | POA: Diagnosis not present

## 2019-01-20 DIAGNOSIS — S0083XA Contusion of other part of head, initial encounter: Secondary | ICD-10-CM | POA: Diagnosis not present

## 2019-07-31 DIAGNOSIS — N200 Calculus of kidney: Secondary | ICD-10-CM | POA: Diagnosis not present

## 2019-07-31 DIAGNOSIS — R1031 Right lower quadrant pain: Secondary | ICD-10-CM | POA: Diagnosis not present

## 2019-07-31 DIAGNOSIS — R8290 Unspecified abnormal findings in urine: Secondary | ICD-10-CM | POA: Diagnosis not present

## 2019-07-31 DIAGNOSIS — R110 Nausea: Secondary | ICD-10-CM | POA: Diagnosis not present

## 2019-08-03 ENCOUNTER — Other Ambulatory Visit: Payer: Self-pay

## 2019-08-04 ENCOUNTER — Encounter: Payer: Self-pay | Admitting: Family Medicine

## 2019-08-04 ENCOUNTER — Ambulatory Visit (INDEPENDENT_AMBULATORY_CARE_PROVIDER_SITE_OTHER): Payer: BLUE CROSS/BLUE SHIELD

## 2019-08-04 ENCOUNTER — Ambulatory Visit (INDEPENDENT_AMBULATORY_CARE_PROVIDER_SITE_OTHER): Payer: BLUE CROSS/BLUE SHIELD | Admitting: Family Medicine

## 2019-08-04 ENCOUNTER — Telehealth: Payer: Self-pay

## 2019-08-04 VITALS — BP 134/83 | HR 71 | Temp 99.1°F | Resp 16 | Ht 76.0 in | Wt 237.4 lb

## 2019-08-04 DIAGNOSIS — J4520 Mild intermittent asthma, uncomplicated: Secondary | ICD-10-CM | POA: Diagnosis not present

## 2019-08-04 DIAGNOSIS — T7840XA Allergy, unspecified, initial encounter: Secondary | ICD-10-CM | POA: Insufficient documentation

## 2019-08-04 DIAGNOSIS — R10A1 Flank pain, right side: Secondary | ICD-10-CM

## 2019-08-04 DIAGNOSIS — N201 Calculus of ureter: Secondary | ICD-10-CM | POA: Diagnosis not present

## 2019-08-04 DIAGNOSIS — R109 Unspecified abdominal pain: Secondary | ICD-10-CM | POA: Diagnosis not present

## 2019-08-04 DIAGNOSIS — R3129 Other microscopic hematuria: Secondary | ICD-10-CM | POA: Diagnosis not present

## 2019-08-04 DIAGNOSIS — J302 Other seasonal allergic rhinitis: Secondary | ICD-10-CM

## 2019-08-04 DIAGNOSIS — J45909 Unspecified asthma, uncomplicated: Secondary | ICD-10-CM | POA: Insufficient documentation

## 2019-08-04 MED ORDER — ALBUTEROL SULFATE HFA 108 (90 BASE) MCG/ACT IN AERS
2.0000 | INHALATION_SPRAY | Freq: Four times a day (QID) | RESPIRATORY_TRACT | 3 refills | Status: DC | PRN
Start: 2019-08-04 — End: 2020-12-21

## 2019-08-04 MED ORDER — TAMSULOSIN HCL 0.4 MG PO CAPS: 0.4000 mg | ORAL_CAPSULE | Freq: Every day | ORAL | 0 refills | Status: DC

## 2019-08-04 NOTE — Progress Notes (Signed)
New Patient Office Visit  Assessment & Plan:  1-2. Right flank pain/Microscopic hematuria - Education provided on kidney stones.  - DG Abd 1 View - Ambulatory referral to Urology - tamsulosin (FLOMAX) 0.4 MG CAPS capsule; Take 1 capsule (0.4 mg total) by mouth daily. (until stone is passed)  Dispense: 10 capsule; Refill: 0 - CT Abdomen Pelvis Wo Contrast; Future  3. Ureteral stone - CT Abdomen Pelvis Wo Contrast; Future  4. Mild intermittent asthma without complication - Well controlled on current regimen.  - albuterol (VENTOLIN HFA) 108 (90 Base) MCG/ACT inhaler; Inhale 2 puffs into the lungs every 6 (six) hours as needed for wheezing or shortness of breath.  Dispense: 18 g; Refill: 3  5. Seasonal allergies - Well controlled on current regimen.    Follow-up: Return in about 1 year (around 08/03/2020) for annual physical.   Deliah Boston, MSN, APRN, FNP-C [facility]  Subjective:  Patient ID: James Sosa, male    DOB: 04-May-1981  Age: 38 y.o. MRN: [id number]  Patient Care Team: Gwenlyn Fudge, FNP as PCP - General (Family Medicine)  CC:  Chief Complaint  Patient presents with  . New Patient (Initial Visit)    [facility]- Patient states it has been a few years since he has been there   . Establish Care  . Annual Exam  . Back Pain    Patient states it has been going on since July. Left lower back and now has moved to right side. Patient went to [facility] Sunday and got treated for kidney stone    HPI James Sosa presents to establish care. Patient is transferring care from [facility]. He states it has been a few years since he has been and he was only going for acute visits. Patient also has concerns regarding possible kidney stones.   Patient reports this past April he started having left sided flank pain which he describes as excruciating. It was so bad he could hardly move or walk around. The pain radiated down  to his left testicle where he described it as pulsating. He tried taking Tylenol and Ibuprofen for the pain which he threw up. He got his wife to take him to the ER and the pain stopped half way there. Due to COVID and resolution of his symptoms, he went back home. The same symptoms occured 4-5 more times, always on the left side, but less severe over the next several months.   On Monday of last week (10 days ago) this pain occurred on the right side. He took some Tylenol and Ibuprofen and a hot bath which helped the symptoms. Friday (6 days ago) the pain started up again; he was fine Saturday morning and it returned Saturday afternoon. Sunday he went to the ER due to the pain which has been constant since. He states they checked his urine and told him he had blood in it. He has not seen blood himself. He was prescribed Norco, Zofran, and Flomax. Imaging was not completed during his visit at the ER. He denies passing any stones as of yet. He currently rates the pain 4-5/10. It was as high as 8/10 and "heading towards a 10" on Sunday when he went to the ER. He is taking the Ibuprofen for the pain and only using the Norco when it gets really bad (1-2 times per day).   Review of Systems  Constitutional: Negative for chills, fever, malaise/fatigue and weight loss.  HENT: Negative for congestion, ear discharge, ear pain, nosebleeds, sinus pain, sore throat and tinnitus.   Eyes: Negative for blurred vision, double vision, pain, discharge and redness.  Respiratory: Negative for cough, shortness of breath and wheezing.   Cardiovascular: Negative for chest pain, palpitations and leg swelling.  Gastrointestinal: Positive for abdominal pain (generalized). Negative for constipation, diarrhea, heartburn, nausea and vomiting.  Genitourinary: Negative for dysuria, frequency and urgency.  Musculoskeletal: Positive for back pain. Negative for myalgias.  Skin: Negative for rash.  Neurological: Negative for dizziness,  seizures, weakness and headaches.  Psychiatric/Behavioral: Negative for depression, substance abuse and suicidal ideas. The patient is not nervous/anxious.      Current Outpatient Medications:  .  cetirizine (ZYRTEC) 10 MG tablet, Take 10 mg by mouth daily., Disp: , Rfl:  .  HYDROcodone-acetaminophen (NORCO/VICODIN) 5-325 MG tablet, Take 1 tablet by mouth every 6 (six) hours as needed., Disp: , Rfl:  .  tamsulosin (FLOMAX) 0.4 MG CAPS capsule, Take 1 capsule (0.4 mg total) by mouth daily. (until stone is passed), Disp: 10 capsule, Rfl: 0 .  albuterol (VENTOLIN HFA) 108 (90 Base) MCG/ACT inhaler, Inhale 2 puffs into the lungs every 6 (six) hours as needed for wheezing or shortness of breath., Disp: 18 g, Rfl: 3  No Known Allergies  Past Medical History:  Diagnosis Date  . Allergy   . Asthma   . Vasovagal syncope     Past Surgical History:  Procedure Laterality Date  . ANKLE SURGERY Left     Family History  Problem Relation Age of Onset  . Thyroid disease Mother   . Diverticulitis Mother   . Heart disease Mother   . Heart attack Mother   . Diabetes Father   . Colon cancer Maternal Grandmother        early 28s  . Asthma Son   . Asthma Son     Social History   Socioeconomic History  . Marital status: Single    Spouse name: Not on file  . Number of children: Not on file  . Years of education: Not on file  . Highest education level: Not on file  Occupational History  . Not on file  Social Needs  . Financial resource strain: Not on file  . Food insecurity    Worry: Not on file    Inability: Not on file  . Transportation needs    Medical: Not on file    Non-medical: Not on file  Tobacco Use  . Smoking status: Current Every Day Smoker    Types: E-cigarettes  . Smokeless tobacco: Never Used  Substance and Sexual Activity  . Alcohol use: Yes    Comment: occ  . Drug use: Never  . Sexual activity: Not on file  Lifestyle  . Physical activity    Days per week: Not  on file    Minutes per session: Not on file  . Stress: Not on file  Relationships  . Social Herbalist on phone: Not on file    Gets together: Not on file    Attends religious service: Not on file    Active member of club or organization: Not on file    Attends meetings of clubs or organizations: Not on file    Relationship status: Not on file  . Intimate partner violence    Fear of current or ex partner: Not on file    Emotionally abused: Not on file    Physically abused: Not on file  Forced sexual activity: Not on file  Other Topics Concern  . Not on file  Social History Narrative  . Not on file    Objective:   Today's Vitals: BP 134/83   Pulse 71   Temp 99.1 F (37.3 C) (Temporal)   Resp 16   Ht 6\' 4"  (1.93 m)   Wt 237 lb 6.4 oz (107.7 kg)   SpO2 95%   BMI 28.90 kg/m   Physical Exam Vitals signs reviewed.  Constitutional:      General: He is not in acute distress.    Appearance: Normal appearance. He is overweight. He is not ill-appearing, toxic-appearing or diaphoretic.  HENT:     Head: Normocephalic and atraumatic.     Right Ear: Tympanic membrane, ear canal and external ear normal. There is no impacted cerumen.     Left Ear: Tympanic membrane, ear canal and external ear normal. There is no impacted cerumen.     Nose: Nose normal. No congestion or rhinorrhea.     Mouth/Throat:     Mouth: Mucous membranes are moist.     Pharynx: Oropharynx is clear. No oropharyngeal exudate or posterior oropharyngeal erythema.  Eyes:     General: No scleral icterus.       Right eye: No discharge.        Left eye: No discharge.     Conjunctiva/sclera: Conjunctivae normal.     Pupils: Pupils are equal, round, and reactive to light.  Neck:     Musculoskeletal: Normal range of motion and neck supple. No neck rigidity or muscular tenderness.  Cardiovascular:     Rate and Rhythm: Normal rate and regular rhythm.     Heart sounds: Normal heart sounds. No murmur. No  friction rub. No gallop.   Pulmonary:     Effort: Pulmonary effort is normal. No respiratory distress.     Breath sounds: Normal breath sounds. No stridor. No wheezing, rhonchi or rales.  Abdominal:     General: Abdomen is flat. Bowel sounds are normal. There is no distension.     Palpations: Abdomen is soft. There is no mass.     Tenderness: There is no abdominal tenderness. There is right CVA tenderness. There is no guarding or rebound.     Hernia: No hernia is present.  Musculoskeletal: Normal range of motion.     Right lower leg: No edema.     Left lower leg: No edema.  Lymphadenopathy:     Cervical: No cervical adenopathy.  Skin:    General: Skin is warm and dry.     Capillary Refill: Capillary refill takes less than 2 seconds.  Neurological:     General: No focal deficit present.     Mental Status: He is alert and oriented to person, place, and time. Mental status is at baseline.  Psychiatric:        Mood and Affect: Mood normal.        Behavior: Behavior normal.        Thought Content: Thought content normal.        Judgment: Judgment normal.

## 2019-08-04 NOTE — Patient Instructions (Signed)

## 2019-08-04 NOTE — Telephone Encounter (Signed)
Noted. CT ordered.

## 2019-08-04 NOTE — Telephone Encounter (Signed)
[facility] called with report on 1 view ABD x ray done today.  Calcifications along spine bilaterally suggestive of ureteral stones. CT urography would be helpful for further evaluation.   MPulliam, CMA/RT(R)

## 2019-08-05 ENCOUNTER — Telehealth: Payer: Self-pay | Admitting: Family Medicine

## 2019-10-01 ENCOUNTER — Telehealth: Payer: Self-pay | Admitting: Family Medicine

## 2019-10-01 NOTE — Telephone Encounter (Signed)
Did patient ever see urology or have his CT scan completed?

## 2019-10-03 NOTE — Telephone Encounter (Signed)
lmtcb

## 2019-10-11 NOTE — Telephone Encounter (Signed)
Left message to ask if patient followed through with urology appointment or why he may not have.   Pease call our office.  Note over a week old and will be filed.

## 2020-08-30 ENCOUNTER — Ambulatory Visit (INDEPENDENT_AMBULATORY_CARE_PROVIDER_SITE_OTHER): Payer: Managed Care, Other (non HMO) | Admitting: Family Medicine

## 2020-08-30 ENCOUNTER — Other Ambulatory Visit: Payer: Self-pay

## 2020-08-30 ENCOUNTER — Ambulatory Visit (INDEPENDENT_AMBULATORY_CARE_PROVIDER_SITE_OTHER): Payer: Managed Care, Other (non HMO)

## 2020-08-30 ENCOUNTER — Encounter: Payer: Self-pay | Admitting: Family Medicine

## 2020-08-30 VITALS — BP 128/79 | HR 59 | Temp 98.3°F | Ht 76.0 in | Wt 209.0 lb

## 2020-08-30 DIAGNOSIS — R109 Unspecified abdominal pain: Secondary | ICD-10-CM

## 2020-08-30 DIAGNOSIS — R102 Pelvic and perineal pain: Secondary | ICD-10-CM | POA: Diagnosis not present

## 2020-08-30 MED ORDER — POLYETHYLENE GLYCOL 3350 17 GM/SCOOP PO POWD: 17.0000 g | Freq: Two times a day (BID) | ORAL | 0 refills | Status: AC

## 2020-08-30 NOTE — Progress Notes (Signed)
Subjective: CC: Flank pain PCP: Gwenlyn Fudge, FNP  James Sosa is a 39 y.o. male presenting to clinic today for:  1. Flank pain Phillips reports flank pain that started about 2 weeks ago. The pain has since improved and is now mild. It is located low bilateral back. It waxes and wanes. It is not currently radiating. The pain was similar to pain to has felt in the past with kidney stones. He also had some urinary frequency last week, which has since improved. He reports the pain significantly improved last week and he left like a kidney stone much have dropped into his bladder. He did not see a stone in his urine. He takes Flomax as needed for kidney stones. He also reports lower abdominal pain x 5 days. It is a dull cramping pain. It has improved since it started. The pain is now a 2/10. It waxes and wanes. He went to UC 4 days ago for his symptoms. They did a UA that was normal and sent his urine for culture. He denies dysuria, blood in stool or urine, nausea, vomiting, diarrhea, or fever. He reports a daily BM that does not require straining.   Relevant past medical, surgical, family, and social history reviewed and updated as indicated.  Allergies and medications reviewed and updated.  No Known Allergies Past Medical History:  Diagnosis Date  . Allergy   . Asthma   . Vasovagal syncope     Current Outpatient Medications:  .  albuterol (VENTOLIN HFA) 108 (90 Base) MCG/ACT inhaler, Inhale 2 puffs into the lungs every 6 (six) hours as needed for wheezing or shortness of breath., Disp: 18 g, Rfl: 3 .  cetirizine (ZYRTEC) 10 MG tablet, Take 10 mg by mouth daily., Disp: , Rfl:  .  tamsulosin (FLOMAX) 0.4 MG CAPS capsule, Take 1 capsule (0.4 mg total) by mouth daily. (until stone is passed), Disp: 10 capsule, Rfl: 0 Social History   Socioeconomic History  . Marital status: Single    Spouse name: Not on file  . Number of children: Not on file  . Years of education: Not on file    . Highest education level: Not on file  Occupational History  . Not on file  Tobacco Use  . Smoking status: Current Every Day Smoker    Types: E-cigarettes  . Smokeless tobacco: Never Used  Vaping Use  . Vaping Use: Every day  Substance and Sexual Activity  . Alcohol use: Yes    Comment: occ  . Drug use: Never  . Sexual activity: Not on file  Other Topics Concern  . Not on file  Social History Narrative  . Not on file   Social Determinants of Health   Financial Resource Strain:   . Difficulty of Paying Living Expenses: Not on file  Food Insecurity:   . Worried About Programme researcher, broadcasting/film/video in the Last Year: Not on file  . Ran Out of Food in the Last Year: Not on file  Transportation Needs:   . Lack of Transportation (Medical): Not on file  . Lack of Transportation (Non-Medical): Not on file  Physical Activity:   . Days of Exercise per Week: Not on file  . Minutes of Exercise per Session: Not on file  Stress:   . Feeling of Stress : Not on file  Social Connections:   . Frequency of Communication with Friends and Family: Not on file  . Frequency of Social Gatherings with Friends and Family: Not  on file  . Attends Religious Services: Not on file  . Active Member of Clubs or Organizations: Not on file  . Attends Banker Meetings: Not on file  . Marital Status: Not on file  Intimate Partner Violence:   . Fear of Current or Ex-Partner: Not on file  . Emotionally Abused: Not on file  . Physically Abused: Not on file  . Sexually Abused: Not on file   Family History  Problem Relation Age of Onset  . Thyroid disease Mother   . Diverticulitis Mother   . Heart disease Mother   . Heart attack Mother   . Diabetes Father   . Colon cancer Maternal Grandmother        early 44s  . Asthma Son   . Asthma Son     Review of Systems  Per HPI.  Objective: Office vital signs reviewed. BP 128/79   Pulse (!) 59   Temp 98.3 F (36.8 C) (Temporal)   Ht 6\' 4"  (1.93  m)   Wt 209 lb (94.8 kg)   BMI 25.44 kg/m   Physical Examination:  Physical Exam Vitals and nursing note reviewed.  Constitutional:      General: He is not in acute distress.    Appearance: Normal appearance. He is normal weight. He is not ill-appearing, toxic-appearing or diaphoretic.  Cardiovascular:     Rate and Rhythm: Normal rate and regular rhythm.     Heart sounds: Normal heart sounds. No murmur heard.   Pulmonary:     Effort: Pulmonary effort is normal.     Breath sounds: Normal breath sounds.  Abdominal:     General: Abdomen is flat. Bowel sounds are normal. There is no distension.     Palpations: Abdomen is soft. There is no mass.     Tenderness: There is abdominal tenderness. There is no right CVA tenderness, left CVA tenderness, guarding or rebound. Negative signs include Murphy's sign and McBurney's sign.     Hernia: No hernia is present.    Musculoskeletal:        General: Normal range of motion.     Right lower leg: No edema.     Left lower leg: No edema.  Skin:    General: Skin is warm and dry.  Neurological:     General: No focal deficit present.     Mental Status: He is alert and oriented to person, place, and time.  Psychiatric:        Mood and Affect: Mood normal.        Behavior: Behavior normal.    Assessment/ Plan: James Sosa was seen today for flank pain.  Diagnoses and all orders for this visit:  Suprapubic pain, acute KUB today in office show large stool burden, radiology report pending. No fever, N/V/D. Mild tenderness in LLQ. Discussed that his symptoms are likely due to constipation. Miralax BID for 7 days. RTO for new or worsening symptoms such as fever, N/V, increased pain or tenderness.  -     DG Abd 1 View -     polyethylene glycol powder (GLYCOLAX/MIRALAX) 17 GM/SCOOP powder; Take 17 g by mouth 2 (two) times daily for 7 days.  Flank pain KUB radiology report pending. Patient may have passed a small kidney stone on his own. Denies  dysuria. Flank pain and urinary frequency has improved. Review UC notes and UA results. No indication for infection. Urine culture still pending. Continue flomax as needed. RTO for new or worsening symptoms.  -  DG Abd 1 View  The above assessment and management plan was discussed with the patient. The patient verbalized understanding of and has agreed to the management plan. Patient is aware to call the clinic if symptoms persist or worsen. Patient is aware when to return to the clinic for a follow-up visit. Patient educated on when it is appropriate to go to the emergency department.   Harlow Mares, FNP-C [facility] [address] [place], [place] [address] [phone]

## 2020-08-30 NOTE — Patient Instructions (Signed)
Abdominal Pain, Adult Pain in the abdomen (abdominal pain) can be caused by many things. Often, abdominal pain is not serious and it gets better with no treatment or by being treated at home. However, sometimes abdominal pain is serious. Your health care provider will ask questions about your medical history and do a physical exam to try to determine the cause of your abdominal pain. Follow these instructions at home:  Medicines Take over-the-counter and prescription medicines only as told by your health care provider. Do not take a laxative unless told by your health care provider. General instructions Watch your condition for any changes. Drink enough fluid to keep your urine pale yellow. Keep all follow-up visits as told by your health care provider. This is important. Contact a health care provider if: Your abdominal pain changes or gets worse. You are not hungry or you lose weight without trying. You are constipated or have diarrhea for more than 2-3 days. You have pain when you urinate or have a bowel movement. Your abdominal pain wakes you up at night. Your pain gets worse with meals, after eating, or with certain foods. You are vomiting and cannot keep anything down. You have a fever. You have blood in your urine. Get help right away if: Your pain does not go away as soon as your health care provider told you to expect. You cannot stop vomiting. Your pain is only in areas of the abdomen, such as the right side or the left lower portion of the abdomen. Pain on the right side could be caused by appendicitis. You have bloody or black stools, or stools that look like tar. You have severe pain, cramping, or bloating in your abdomen. You have signs of dehydration, such as: Dark urine, very little urine, or no urine. Cracked lips. Dry mouth. Sunken eyes. Sleepiness. Weakness. You have trouble breathing or chest pain. Summary Often, abdominal pain is not serious and it gets  better with no treatment or by being treated at home. However, sometimes abdominal pain is serious. Watch your condition for any changes. Take over-the-counter and prescription medicines only as told by your health care provider. Contact a health care provider if your abdominal pain changes or gets worse. Get help right away if you have severe pain, cramping, or bloating in your abdomen. This information is not intended to replace advice given to you by your health care provider. Make sure you discuss any questions you have with your health care provider. Document Revised: 03/21/2019 Document Reviewed: 03/21/2019 Elsevier Patient Education  2020 [employer] Kidney Stones  Kidney stones are solid, rock-like deposits that form inside of the kidneys. The kidneys are a pair of organs that make urine. A kidney stone may form in a kidney and move into other parts of the urinary tract, including the tubes that connect the kidneys to the bladder (ureters), the bladder, and the tube that carries urine out of the body (urethra). As the stone moves through these areas, it can cause intense pain and block the flow of urine. Kidney stones are created when high levels of certain minerals are found in the urine. The stones are usually passed out of the body through urination, but in some cases, medical treatment may be needed to remove them. What are the causes? Kidney stones may be caused by:  A condition in which certain glands produce too much parathyroid hormone (primary hyperparathyroidism), which causes too much calcium buildup in the blood.  A buildup of uric acid crystals  in the bladder (hyperuricosuria). Uric acid is a chemical that the body produces when you eat certain foods. It usually exits the body in the urine.  Narrowing (stricture) of one or both of the ureters.  A kidney blockage that is present at birth (congenital obstruction).  Past surgery on the kidney or the ureters, such as  gastric bypass surgery. What increases the risk? The following factors may make you more likely to develop this condition:  Having had a kidney stone in the past.  Having a family history of kidney stones.  Not drinking enough water.  Eating a diet that is high in protein, salt (sodium), or sugar.  Being overweight or obese. What are the signs or symptoms? Symptoms of a kidney stone may include:  Pain in the side of the abdomen, right below the ribs (flank pain). Pain usually spreads (radiates) to the groin.  Needing to urinate frequently or urgently.  Painful urination.  Blood in the urine (hematuria).  Nausea.  Vomiting.  Fever and chills. How is this diagnosed? This condition may be diagnosed based on:  Your symptoms and medical history.  A physical exam.  Blood tests.  Urine tests. These may be done before and after the stone passes out of your body through urination.  Imaging tests, such as a CT scan, abdominal X-ray, or ultrasound.  A procedure to examine the inside of the bladder (cystoscopy). How is this treated? Treatment for kidney stones depends on the size, location, and makeup of the stones. Kidney stones will often pass out of the body through urination. You may need to:  Increase your fluid intake to help pass the stone. In some cases, you may be given fluids through an IV and may need to be monitored at the hospital.  Take medicine for pain.  Make changes in your diet to help prevent kidney stones from coming back. Sometimes, medical procedures are needed to remove a kidney stone. This may involve:  A procedure to break up kidney stones using: ? A focused beam of light (laser therapy). ? Shock waves (extracorporeal shock wave lithotripsy).  Surgery to remove kidney stones. This may be needed if you have severe pain or have stones that block your urinary tract. Follow these instructions at home: Medicines  Take over-the-counter and  prescription medicines only as told by your health care provider.  Ask your health care provider if the medicine prescribed to you requires you to avoid driving or using heavy machinery. Eating and drinking  Drink enough fluid to keep your urine pale yellow. You may be instructed to drink at least 8-10 glasses of water each day. This will help you pass the kidney stone.  If directed, change your diet. This may include: ? Limiting how much sodium you eat. ? Eating more fruits and vegetables. ? Limiting how much animal protein--such as red meat, poultry, fish, and eggs--you eat.  Follow instructions from your health care provider about eating or drinking restrictions. General instructions  Collect urine samples as told by your health care provider. You may need to collect a urine sample: ? 24 hours after you pass the stone. ? 8-12 weeks after passing the kidney stone, and every 6-12 months after that.  Strain your urine every time you urinate, for as long as directed. Use the strainer that your health care provider recommends.  Do not throw out the kidney stone after passing it. Keep the stone so it can be tested by your health care provider. Testing  the makeup of your kidney stone may help prevent you from getting kidney stones in the future.  Keep all follow-up visits as told by your health care provider. This is important. You may need follow-up X-rays or ultrasounds to make sure that your stone has passed. How is this prevented? To prevent another kidney stone:  Drink enough fluid to keep your urine pale yellow. This is the best way to prevent kidney stones.  Eat a healthy diet and follow recommendations from your health care provider about foods to avoid. You may be instructed to eat a low-protein diet. Recommendations vary depending on the type of kidney stone that you have.  Maintain a healthy weight. Where to find more information  National Kidney Foundation (NKF):  www.kidney.org  [facility]): www.urologyhealth.org Contact a health care provider if:  You have pain that gets worse or does not get better with medicine. Get help right away if:  You have a fever or chills.  You develop severe pain.  You develop new abdominal pain.  You faint.  You are unable to urinate. Summary  Kidney stones are solid, rock-like deposits that form inside of the kidneys.  Kidney stones can cause nausea, vomiting, blood in the urine, abdominal pain, and the urge to urinate frequently.  Treatment for kidney stones depends on the size, location, and makeup of the stones. Kidney stones will often pass out of the body through urination.  Kidney stones can be prevented by drinking enough fluids, eating a healthy diet, and maintaining a healthy weight. This information is not intended to replace advice given to you by your health care provider. Make sure you discuss any questions you have with your health care provider. Document Revised: 03/29/2019 Document Reviewed: 03/29/2019 Elsevier Patient Education  2020 [employer]. Constipation, Adult Constipation is when a person has fewer bowel movements in a week than normal, has difficulty having a bowel movement, or has stools that are dry, hard, or larger than normal. Constipation may be caused by an underlying condition. It may become worse with age if a person takes certain medicines and does not take in enough fluids. Follow these instructions at home: Eating and drinking   Eat foods that have a lot of fiber, such as fresh fruits and vegetables, whole grains, and beans.  Limit foods that are high in fat, low in fiber, or overly processed, such as french fries, hamburgers, cookies, candies, and soda.  Drink enough fluid to keep your urine clear or pale yellow. General instructions  Exercise regularly or as told by your health care provider.  Go to the restroom when you have the urge to go. Do  not hold it in.  Take over-the-counter and prescription medicines only as told by your health care provider. These include any fiber supplements.  Practice pelvic floor retraining exercises, such as deep breathing while relaxing the lower abdomen and pelvic floor relaxation during bowel movements.  Watch your condition for any changes.  Keep all follow-up visits as told by your health care provider. This is important. Contact a health care provider if:  You have pain that gets worse.  You have a fever.  You do not have a bowel movement after 4 days.  You vomit.  You are not hungry.  You lose weight.  You are bleeding from the anus.  You have thin, pencil-like stools. Get help right away if:  You have a fever and your symptoms suddenly get worse.  You leak stool or  have blood in your stool.  Your abdomen is bloated.  You have severe pain in your abdomen.  You feel dizzy or you faint. This information is not intended to replace advice given to you by your health care provider. Make sure you discuss any questions you have with your health care provider. Document Revised: 10/23/2017 Document Reviewed: 04/30/2016 Elsevier Patient Education  2020 [employer]

## 2020-10-17 ENCOUNTER — Encounter: Payer: Self-pay | Admitting: *Deleted

## 2020-11-21 ENCOUNTER — Encounter: Payer: Managed Care, Other (non HMO) | Admitting: Family Medicine

## 2020-12-06 ENCOUNTER — Encounter: Payer: Managed Care, Other (non HMO) | Admitting: Family Medicine

## 2020-12-21 ENCOUNTER — Encounter: Payer: Self-pay | Admitting: Family Medicine

## 2020-12-21 ENCOUNTER — Ambulatory Visit (INDEPENDENT_AMBULATORY_CARE_PROVIDER_SITE_OTHER): Payer: Managed Care, Other (non HMO) | Admitting: Family Medicine

## 2020-12-21 ENCOUNTER — Other Ambulatory Visit: Payer: Self-pay

## 2020-12-21 VITALS — BP 124/80 | HR 70 | Temp 98.4°F | Ht 76.0 in | Wt 210.0 lb

## 2020-12-21 DIAGNOSIS — Z0001 Encounter for general adult medical examination with abnormal findings: Secondary | ICD-10-CM

## 2020-12-21 DIAGNOSIS — J4520 Mild intermittent asthma, uncomplicated: Secondary | ICD-10-CM | POA: Diagnosis not present

## 2020-12-21 DIAGNOSIS — Z0000 Encounter for general adult medical examination without abnormal findings: Secondary | ICD-10-CM

## 2020-12-21 DIAGNOSIS — J302 Other seasonal allergic rhinitis: Secondary | ICD-10-CM | POA: Diagnosis not present

## 2020-12-21 NOTE — Progress Notes (Signed)
Assessment & Plan:  1. Well adult exam - Preventive health education provided.  Patient to schedule his COVID-19 booster.  He declined HIV and hepatitis C screening, influenza and tetanus vaccines. - CMP14+EGFR - CBC with Differential/Platelet - Lipid panel - PSA, total and free  2. Mild intermittent asthma without complication - Controlled.  Patient has a rescue inhaler that he can use if needed.  3. Seasonal allergies - Well controlled on current regimen.    Follow-up: Return in about 1 year (around 12/21/2021) for annual physical.   James Limes, MSN, APRN, FNP-C [facility]  Subjective:  Patient ID: James [facility], male    DOB: August 09, 1981  Age: 40 y.o. MRN: [id number]  Patient Care Team: Loman Brooklyn, FNP as PCP - General (Family Medicine)   CC:  Chief Complaint  Patient presents with  . Annual Exam    HPI James Sosa presents for his annual physical.  Diet: Healthy, Exercise: Slacked off due to work recently but plans on starting back Last eye exam: Not regular Last dental exam: Every 6 months Hepatitis C Screening: Declined PSA: Never Immunizations: Flu Vaccine: declined Tdap Vaccine: declined  COVID-19 Vaccine: Will get his booster  DEPRESSION SCREENING PHQ 2/9 Scores 12/21/2020 08/30/2020 08/04/2019  PHQ - 2 Score 0 0 0     Asthma: Patient has an albuterol inhaler at home and states he has not had to use it in years.  Allergies: Controlled with Zyrtec.  Review of Systems  Constitutional: Negative for chills, fever, malaise/fatigue and weight loss.  HENT: Negative for congestion, ear discharge, ear pain, nosebleeds, sinus pain, sore throat and tinnitus.   Eyes: Negative for blurred vision, double vision, pain, discharge and redness.  Respiratory: Negative for cough, shortness of breath and wheezing.   Cardiovascular: Negative for chest pain, palpitations and leg swelling.  Gastrointestinal: Negative for abdominal pain,  constipation, diarrhea, heartburn, nausea and vomiting.  Genitourinary: Negative for dysuria, frequency and urgency.       Denies trouble initiating a urine stream, weak stream, split stream, nocturia, and dribbling.   Musculoskeletal: Negative for myalgias.  Skin: Negative for rash.  Neurological: Negative for dizziness, seizures, weakness and headaches.  Psychiatric/Behavioral: Negative for depression, substance abuse and suicidal ideas. The patient is not nervous/anxious.      Current Outpatient Medications:  .  cetirizine (ZYRTEC) 10 MG tablet, Take 10 mg by mouth daily., Disp: , Rfl:   Allergies  Allergen Reactions  . Other     Past Medical History:  Diagnosis Date  . Allergy   . Asthma   . Vasovagal syncope     Past Surgical History:  Procedure Laterality Date  . ANKLE SURGERY Left     Family History  Problem Relation Age of Onset  . Thyroid disease Mother   . Diverticulitis Mother   . Heart disease Mother   . Heart attack Mother   . Diabetes Father   . Colon cancer Maternal Grandmother        early 70s  . Asthma Son   . Asthma Son     Social History   Socioeconomic History  . Marital status: Single    Spouse name: Not on file  . Number of children: Not on file  . Years of education: Not on file  . Highest education level: Not on file  Occupational History  . Not on file  Tobacco Use  . Smoking status: Current Every Day Smoker    Types: E-cigarettes  .  Smokeless tobacco: Never Used  Vaping Use  . Vaping Use: Every day  Substance and Sexual Activity  . Alcohol use: Yes    Comment: occ  . Drug use: Never  . Sexual activity: Not on file  Other Topics Concern  . Not on file  Social History Narrative  . Not on file   Social Determinants of Health   Financial Resource Strain: Not on file  Food Insecurity: Not on file  Transportation Needs: Not on file  Physical Activity: Not on file  Stress: Not on file  Social Connections: Not on file   Intimate Partner Violence: Not on file      Objective:    BP 124/80   Pulse 70   Temp 98.4 F (36.9 C) (Temporal)   Ht $R'6\' 4"'pm$  (1.93 m)   Wt 210 lb (95.3 kg)   SpO2 100%   BMI 25.56 kg/m   Wt Readings from Last 3 Encounters:  12/21/20 210 lb (95.3 kg)  08/30/20 209 lb (94.8 kg)  08/04/19 237 lb 6.4 oz (107.7 kg)    Physical Exam Vitals reviewed.  Constitutional:      General: He is not in acute distress.    Appearance: Normal appearance. He is normal weight. He is not ill-appearing, toxic-appearing or diaphoretic.  HENT:     Head: Normocephalic and atraumatic.     Right Ear: Tympanic membrane, ear canal and external ear normal. There is no impacted cerumen.     Left Ear: Tympanic membrane, ear canal and external ear normal. There is no impacted cerumen.     Nose: Nose normal. No congestion or rhinorrhea.     Mouth/Throat:     Mouth: Mucous membranes are moist.     Pharynx: Oropharynx is clear. No oropharyngeal exudate or posterior oropharyngeal erythema.  Eyes:     General: No scleral icterus.       Right eye: No discharge.        Left eye: No discharge.     Conjunctiva/sclera: Conjunctivae normal.     Pupils: Pupils are equal, round, and reactive to light.  Cardiovascular:     Rate and Rhythm: Normal rate and regular rhythm.     Heart sounds: Normal heart sounds. No murmur heard. No friction rub. No gallop.   Pulmonary:     Effort: Pulmonary effort is normal. No respiratory distress.     Breath sounds: Normal breath sounds. No stridor. No wheezing, rhonchi or rales.  Abdominal:     General: Abdomen is flat. Bowel sounds are normal. There is no distension.     Palpations: Abdomen is soft. There is no mass.     Tenderness: There is no abdominal tenderness. There is no guarding or rebound.     Hernia: No hernia is present.  Musculoskeletal:        General: Normal range of motion.     Cervical back: Normal range of motion and neck supple. No rigidity. No muscular  tenderness.     Right lower leg: No edema.     Left lower leg: No edema.  Lymphadenopathy:     Cervical: No cervical adenopathy.  Skin:    General: Skin is warm and dry.     Capillary Refill: Capillary refill takes less than 2 seconds.  Neurological:     General: No focal deficit present.     Mental Status: He is alert and oriented to person, place, and time. Mental status is at baseline.  Psychiatric:  Mood and Affect: Mood normal.        Behavior: Behavior normal.        Thought Content: Thought content normal.        Judgment: Judgment normal.     No results found for: TSH No results found for: WBC, HGB, HCT, MCV, PLT No results found for: NA, K, CHLORIDE, CO2, GLUCOSE, BUN, CREATININE, BILITOT, ALKPHOS, AST, ALT, PROT, ALBUMIN, CALCIUM, ANIONGAP, EGFR, GFR No results found for: CHOL No results found for: HDL No results found for: LDLCALC No results found for: TRIG No results found for: CHOLHDL No results found for: HGBA1C

## 2020-12-21 NOTE — Patient Instructions (Signed)

## 2020-12-22 LAB — PSA, TOTAL AND FREE
PSA, Free Pct: 48.3 %
PSA, Free: 0.29 ng/mL
Prostate Specific Ag, Serum: 0.6 ng/mL (ref 0.0–4.0)

## 2020-12-22 LAB — CMP14+EGFR
ALT: 14 IU/L (ref 0–44)
AST: 15 IU/L (ref 0–40)
Albumin/Globulin Ratio: 2.2 (ref 1.2–2.2)
Albumin: 4.9 g/dL (ref 4.0–5.0)
Alkaline Phosphatase: 64 IU/L (ref 44–121)
BUN/Creatinine Ratio: 13 (ref 9–20)
BUN: 12 mg/dL (ref 6–24)
Bilirubin Total: 0.4 mg/dL (ref 0.0–1.2)
CO2: 25 mmol/L (ref 20–29)
Calcium: 9.8 mg/dL (ref 8.7–10.2)
Chloride: 103 mmol/L (ref 96–106)
Creatinine, Ser: 0.89 mg/dL (ref 0.76–1.27)
GFR calc Af Amer: 123 mL/min/{1.73_m2} (ref >59)
GFR calc non Af Amer: 106 mL/min/{1.73_m2} (ref >59)
Globulin, Total: 2.2 g/dL (ref 1.5–4.5)
Glucose: 88 mg/dL (ref 65–99)
Potassium: 4.6 mmol/L (ref 3.5–5.2)
Sodium: 140 mmol/L (ref 134–144)
Total Protein: 7.1 g/dL (ref 6.0–8.5)

## 2020-12-22 LAB — LIPID PANEL
Chol/HDL Ratio: 4.1 ratio (ref 0.0–5.0)
Cholesterol, Total: 182 mg/dL (ref 100–199)
HDL: 44 mg/dL (ref >39)
LDL Chol Calc (NIH): 125 mg/dL — ABNORMAL HIGH (ref 0–99)
Triglycerides: 67 mg/dL (ref 0–149)
VLDL Cholesterol Cal: 13 mg/dL (ref 5–40)

## 2020-12-22 LAB — CBC WITH DIFFERENTIAL/PLATELET
Basophils Absolute: 0 10*3/uL (ref 0.0–0.2)
Basos: 0 %
EOS (ABSOLUTE): 0 10*3/uL (ref 0.0–0.4)
Eos: 1 %
Hematocrit: 41.4 % (ref 37.5–51.0)
Hemoglobin: 13.7 g/dL (ref 13.0–17.7)
Immature Grans (Abs): 0 10*3/uL (ref 0.0–0.1)
Immature Granulocytes: 0 %
Lymphocytes Absolute: 1.7 10*3/uL (ref 0.7–3.1)
Lymphs: 24 %
MCH: 27 pg (ref 26.6–33.0)
MCHC: 33.1 g/dL (ref 31.5–35.7)
MCV: 82 fL (ref 79–97)
Monocytes Absolute: 0.4 10*3/uL (ref 0.1–0.9)
Monocytes: 6 %
Neutrophils Absolute: 4.9 10*3/uL (ref 1.4–7.0)
Neutrophils: 69 %
Platelets: 181 10*3/uL (ref 150–450)
RBC: 5.07 x10E6/uL (ref 4.14–5.80)
RDW: 12.5 % (ref 11.6–15.4)
WBC: 7.2 10*3/uL (ref 3.4–10.8)

## 2020-12-24 ENCOUNTER — Encounter: Payer: Self-pay | Admitting: Family Medicine

## 2020-12-24 DIAGNOSIS — E7800 Pure hypercholesterolemia, unspecified: Secondary | ICD-10-CM | POA: Insufficient documentation

## 2020-12-24 HISTORY — DX: Pure hypercholesterolemia, unspecified: E78.00

## 2021-01-31 ENCOUNTER — Telehealth: Payer: 59 | Admitting: Physician Assistant

## 2021-01-31 DIAGNOSIS — J310 Chronic rhinitis: Secondary | ICD-10-CM

## 2021-01-31 NOTE — Progress Notes (Signed)
I have spent 5 minutes in review of e-visit questionnaire, review and updating patient chart, medical decision making and response to patient.   Stevie Charter Cody Greely Atiyeh, PA-C    

## 2021-01-31 NOTE — Progress Notes (Signed)
E visit for Allergic Rhinitis We are sorry that you are not feeling well.  Here is how we plan to help!  Based on what you have shared with me it looks like you have Allergic Rhinitis.  Rhinitis is when a reaction occurs that causes nasal congestion, runny nose, sneezing, and itching.  Most types of rhinitis are caused by an inflammation and are associated with symptoms in the eyes ears or throat. There are several types of rhinitis.  The most common are acute rhinitis, which is usually caused by a viral illness, allergic or seasonal rhinitis, and nonallergic or year-round rhinitis.  Nasal allergies occur certain times of the year.  Allergic rhinitis is caused when allergens in the air trigger the release of histamine in the body.  Histamine causes itching, swelling, and fluid to build up in the fragile linings of the nasal passages, sinuses and eyelids.  An itchy nose and clear discharge are common.  I recommend the following over the counter treatments: You should take a daily dose of antihistamine - I would recommend switching from current antihistamine to Xyzal 5 mg take 1 tablet daily which you can get over the counter.   I also would recommend a nasal spray: Saline 1 spray into each nostril as needed in addition to OTC Flonase or Nasacort.  You may also benefit from eye drops such as: Visine 1-2 drops each eye twice daily as needed   If these changes are getting things under control, I recommend you reach out to your PCP as other prescription allergy medications may need to be considered (singulair for example) or they may want to refer you to ENT or an allergist.   HOME CARE:   You can use an over-the-counter saline nasal spray as needed  Avoid areas where there is heavy dust, mites, or molds  Stay indoors on windy days during the pollen season  Keep windows closed in home, at least in bedroom; use air conditioner.  Use high-efficiency house air filter  Keep windows closed in  car, turn AC on re-circulate  Avoid playing out with dog during pollen season  GET HELP RIGHT AWAY IF:   If your symptoms do not improve within 10 days  You become short of breath  You develop yellow or green discharge from your nose for over 3 days  You have coughing fits  MAKE SURE YOU:   Understand these instructions  Will watch your condition  Will get help right away if you are not doing well or get worse  Thank you for choosing an e-visit. Your e-visit answers were reviewed by a board certified advanced clinical practitioner to complete your personal care plan. Depending upon the condition, your plan could have included both over the counter or prescription medications. Please review your pharmacy choice. Be sure that the pharmacy you have chosen is open so that you can pick up your prescription now.  If there is a problem you may message your provider in MyChart to have the prescription routed to another pharmacy. Your safety is important to [place]. If you have drug allergies check your prescription carefully.  For the next 24 hours, you can use MyChart to ask questions about today's visit, request a non-urgent call back, or ask for a work or school excuse from your e-visit provider. You will get an email in the next two days asking about your experience. I hope that your e-visit has been valuable and will speed your recovery.

## 2021-12-27 ENCOUNTER — Telehealth: Payer: Self-pay | Admitting: Family Medicine

## 2021-12-27 ENCOUNTER — Encounter: Payer: Self-pay | Admitting: Nurse Practitioner

## 2021-12-27 ENCOUNTER — Ambulatory Visit (INDEPENDENT_AMBULATORY_CARE_PROVIDER_SITE_OTHER): Payer: Managed Care, Other (non HMO) | Admitting: Nurse Practitioner

## 2021-12-27 DIAGNOSIS — R110 Nausea: Secondary | ICD-10-CM | POA: Diagnosis not present

## 2021-12-27 DIAGNOSIS — R197 Diarrhea, unspecified: Secondary | ICD-10-CM | POA: Diagnosis not present

## 2021-12-27 LAB — VERITOR FLU A/B WAIVED
Influenza A: NEGATIVE
Influenza B: NEGATIVE

## 2021-12-27 MED ORDER — LOPERAMIDE HCL 2 MG PO TABS: 2.0000 mg | ORAL_TABLET | Freq: Four times a day (QID) | ORAL | 0 refills | Status: DC | PRN

## 2021-12-27 MED ORDER — ONDANSETRON HCL 4 MG PO TABS: 4.0000 mg | ORAL_TABLET | Freq: Three times a day (TID) | ORAL | 0 refills | Status: DC | PRN

## 2021-12-27 NOTE — Telephone Encounter (Signed)
Please review, flu test is back and is negative.  Awaiting Covid.

## 2021-12-27 NOTE — Progress Notes (Addendum)
° °  Virtual Visit  Note Due to COVID-19 pandemic this visit was conducted virtually. This visit type was conducted due to national recommendations for restrictions regarding the COVID-19 Pandemic (e.g. social distancing, sheltering in place) in an effort to limit this patient's exposure and mitigate transmission in our community. All issues noted in this document were discussed and addressed.  A physical exam was not performed with this format.  I connected with James Sosa on 12/28/21 at 10:30 am  by telephone and verified that I am speaking with the correct person using two identifiers. James Sosa is currently located at home  during visit. The provider, Ivy Lynn, NP is located in their office at time of visit.  I discussed the limitations, risks, security and privacy concerns of performing an evaluation and management service by telephone and the availability of in person appointments. I also discussed with the patient that there may be a patient responsible charge related to this service. The patient expressed understanding and agreed to proceed.   History and Present Illness:  Diarrhea  This is a new problem. The current episode started yesterday. The problem occurs 2 to 4 times per day. The problem has been unchanged. Pertinent negatives include no abdominal pain, chills, coughing or fever. Nothing aggravates the symptoms. There are no known risk factors. He has tried nothing for the symptoms.   Nausea: not well controlled, patient has used nothing for symptoms, no fever, abdominal cramping associated with symptoms     Review of Systems  Constitutional: Negative.  Negative for chills and fever.  HENT: Negative.    Respiratory: Negative.  Negative for cough.   Cardiovascular: Negative.   Gastrointestinal:  Positive for diarrhea and nausea. Negative for abdominal pain.  Skin: Negative.  Negative for rash.  All other systems reviewed and are  negative.   Observations/Objective: Televisit patient is not in distress.  Assessment and Plan:  Nausea not well controlled, encouraged BRAT diet, increase hydration, take medication as prescribed, Follow up with unresolved symptom.   Zofran 4 mg tablet by mouth as needed for nausea Diarrhea not well controled, imodium by mouth as needed  Completed covid-19 and flu swab with results pending  Follow Up Instructions: Follow-up with worsening unresolved symptoms.    I discussed the assessment and treatment plan with the patient. The patient was provided an opportunity to ask questions and all were answered. The patient agreed with the plan and demonstrated an understanding of the instructions.   The patient was advised to call back or seek an in-person evaluation if the symptoms worsen or if the condition fails to improve as anticipated.  The above assessment and management plan was discussed with the patient. The patient verbalized understanding of and has agreed to the management plan. Patient is aware to call the clinic if symptoms persist or worsen. Patient is aware when to return to the clinic for a follow-up visit. Patient educated on when it is appropriate to go to the emergency department.   Time call ended:  10:40 am   I provided 10 minutes of  non face-to-face time during this encounter.    Ivy Lynn, NP

## 2021-12-27 NOTE — Patient Instructions (Signed)
Diarrhea, Adult °Diarrhea is when you pass loose and watery poop (stool) often. Diarrhea can make you feel weak and cause you to lose water in your body (get dehydrated). Losing water in your body can cause you to: °Feel tired and thirsty. °Have a dry mouth. °Go pee (urinate) less often. °Diarrhea often lasts 2-3 days. However, it can last longer if it is a sign of something more serious. It is important to treat your diarrhea as told by your doctor. °Follow these instructions at home: °Eating and drinking °  °Follow these instructions as told by your doctor: °Take an ORS (oral rehydration solution). This is a drink that helps you replace fluids and minerals your body lost. It is sold at pharmacies and stores. °Drink plenty of fluids, such as: °Water. °Ice chips. °Diluted fruit juice. °Low-calorie sports drinks. °Milk, if you want. °Avoid drinking fluids that have a lot of sugar or caffeine in them. °Eat bland, easy-to-digest foods in small amounts as you are able. These foods include: °Bananas. °Applesauce. °Rice. °Low-fat (lean) meats. °Toast. °Crackers. °Avoid alcohol. °Avoid spicy or fatty foods. ° °Medicines °Take over-the-counter and prescription medicines only as told by your doctor. °If you were prescribed an antibiotic medicine, take it as told by your doctor. Do not stop using the antibiotic even if you start to feel better. °General instructions ° °Wash your hands often using soap and water. If soap and water are not available, use a hand sanitizer. Others in your home should wash their hands as well. Hands should be washed: °After using the toilet or changing a diaper. °Before preparing, cooking, or serving food. °While caring for a sick person. °While visiting someone in a hospital. °Drink enough fluid to keep your pee (urine) pale yellow. °Rest at home while you get better. °Take a warm bath to help with any burning or pain from having diarrhea. °Watch your condition for any changes. °Keep all  follow-up visits as told by your doctor. This is important. °Contact a doctor if: °You have a fever. °Your diarrhea gets worse. °You have new symptoms. °You cannot keep fluids down. °You feel light-headed or dizzy. °You have a headache. °You have muscle cramps. °Get help right away if: °You have chest pain. °You feel very weak or you pass out (faint). °You have bloody or black poop or poop that looks like tar. °You have very bad pain, cramping, or bloating in your belly (abdomen). °You have trouble breathing or you are breathing very quickly. °Your heart is beating very quickly. °Your skin feels cold and clammy. °You feel confused. °You have signs of losing too much water in your body, such as: °Dark pee, very little pee, or no pee. °Cracked lips. °Dry mouth. °Sunken eyes. °Sleepiness. °Weakness. °Summary °Diarrhea is when you pass loose and watery poop (stool) often. °Diarrhea can make you feel weak and cause you to lose water in your body (get dehydrated). °Take an ORS (oral rehydration solution). This is a drink that is sold at pharmacies and stores. °Eat bland, easy-to-digest foods in small amounts as you are able. °Contact a doctor if your condition gets worse. Get help right away if you have signs that you have lost too much water in your body. °This information is not intended to replace advice given to you by your health care provider. Make sure you discuss any questions you have with your health care provider. °Document Revised: 05/22/2021 Document Reviewed: 05/22/2021 °Elsevier Patient Education © 2022 [employer] °Nausea, Adult °Nausea is   the feeling of having an upset stomach or that you are about to vomit. Nausea on its own is not usually a serious concern, but it may be an early sign of a more serious medical problem. As nausea gets worse, it can lead to vomiting. If vomiting develops, or if you are not able to drink enough fluids, you are at risk of becoming dehydrated. °Dehydration can make you  tired and thirsty, cause you to have a dry mouth, and decrease how often you urinate. Older adults and people with other diseases or a weak disease-fighting system (immune system) are at higher risk for dehydration. The main goals of treating your nausea are: °To relieve your nausea. °To limit repeated nausea episodes. °To prevent vomiting and dehydration. °Follow these instructions at home: °Watch your symptoms for any changes. Tell your health care provider about them. °Eating and drinking °  °Take an oral rehydration solution (ORS). This is a drink that is sold at pharmacies and retail stores. °Drink clear fluids slowly and in small amounts as you are able. Clear fluids include water, ice chips, low-calorie sports drinks, and fruit juice that has water added (diluted fruit juice). °Eat bland, easy-to-digest foods in small amounts as you are able. These foods include bananas, applesauce, rice, lean meats, toast, and crackers. °Avoid drinking fluids that contain a lot of sugar or caffeine, such as energy drinks, sports drinks, and soda. °Avoid alcohol. °Avoid spicy or fatty foods. °General instructions °Take over-the-counter and prescription medicines only as told by your health care provider. °Rest at home while you recover. °Drink enough fluid to keep your urine pale yellow. °Breathe slowly and deeply when you feel nauseous. °Avoid smelling things that have strong odors. °Wash your hands often using soap and water for at least 20 seconds. If soap and water are not available, use hand sanitizer. °Make sure that everyone in your household washes their hands well and often. °Keep all follow-up visits. This is important. °Contact a health care provider if: °Your nausea gets worse. °Your nausea does not go away after two days. °You vomit multiple times. °You cannot drink fluids without vomiting. °You have any of the following: °New symptoms. °A fever. °A headache. °Muscle cramps. °A rash. °Pain while urinating. °You  feel light-headed or dizzy. °Get help right away if: °You have pain in your chest, neck, arm, or jaw. °You feel extremely weak or you faint. °You have vomit that is bright red or looks like coffee grounds. °You have bloody or black stools (feces) or stools that look like tar. °You have a severe headache, a stiff neck, or both. °You have severe pain, cramping, or bloating in your abdomen. °You have difficulty breathing or are breathing very quickly. °Your heart is beating very quickly. °Your skin feels cold and clammy. °You feel confused. °You have signs of dehydration, such as: °Dark urine, very little urine, or no urine. °Cracked lips. °Dry mouth. °Sunken eyes. °Sleepiness. °Weakness. °These symptoms may be an emergency. Get help right away. Call 911. °Do not wait to see if the symptoms will go away. °Do not drive yourself to the hospital. °Summary °Nausea is the feeling that you have an upset stomach or that you are about to vomit. Nausea on its own is not usually a serious concern, but it may be an early sign of a more serious medical problem. °If vomiting develops, or if you are not able to drink enough fluids, you are at risk of becoming dehydrated. °Follow recommendations for eating   and drinking and take over-the-counter and prescription medicines only as told by your health care provider. Contact a health care provider right away if your symptoms worsen or you have new symptoms. Keep all follow-up visits. This is important. This information is not intended to replace advice given to you by your health care provider. Make sure you discuss any questions you have with your health care provider. Document Revised: 05/17/2021 Document Reviewed: 05/17/2021 Elsevier Patient Education  2022 [employer].

## 2021-12-28 LAB — SARS-COV-2, NAA 2 DAY TAT

## 2021-12-28 LAB — NOVEL CORONAVIRUS, NAA: SARS-CoV-2, NAA: NOT DETECTED

## 2021-12-30 NOTE — Telephone Encounter (Signed)
Patient was informed of negative covid test results

## 2022-06-03 IMAGING — DX DG ABDOMEN 1V
2 series · 2 of 2 positions shown · non-contrast
Comparison: August 04, 2019

CLINICAL DATA: Flank/suprapubic region pain

EXAM:
ABDOMEN - 1 VIEW

[abdomen kub (1 of 2)]
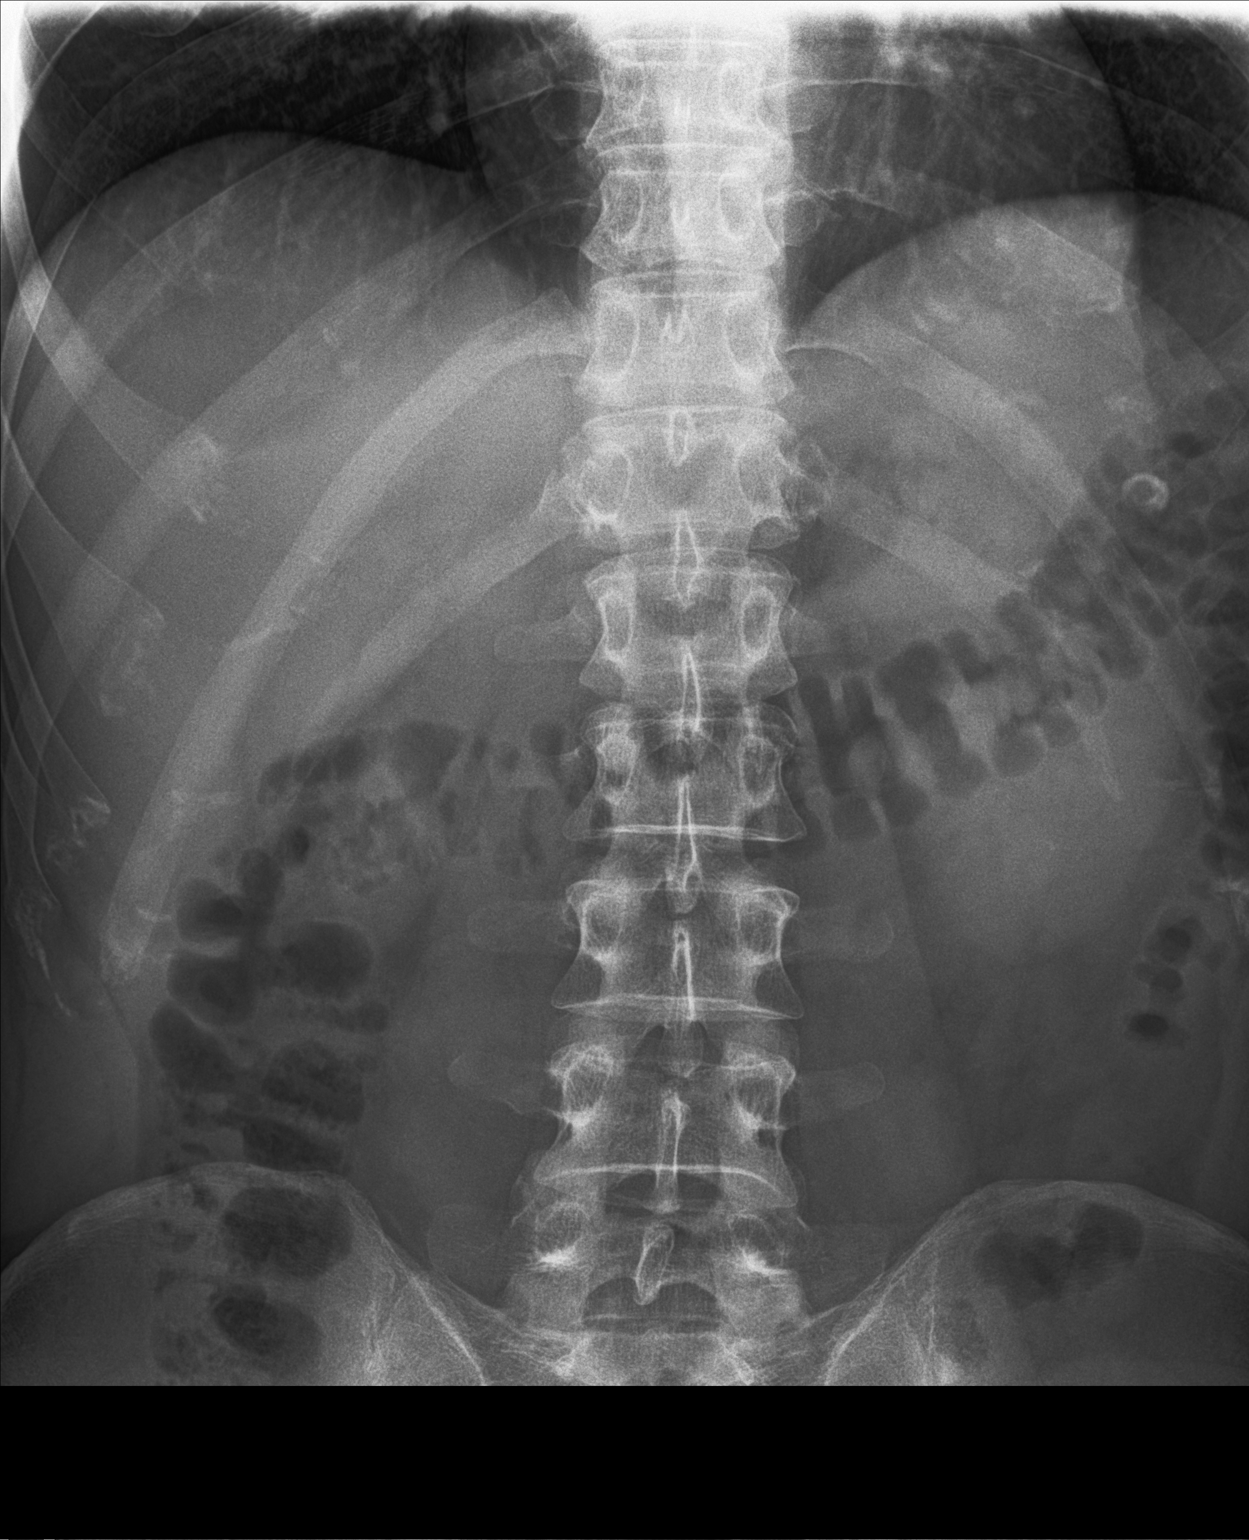

[abdomen kub (2 of 2)]
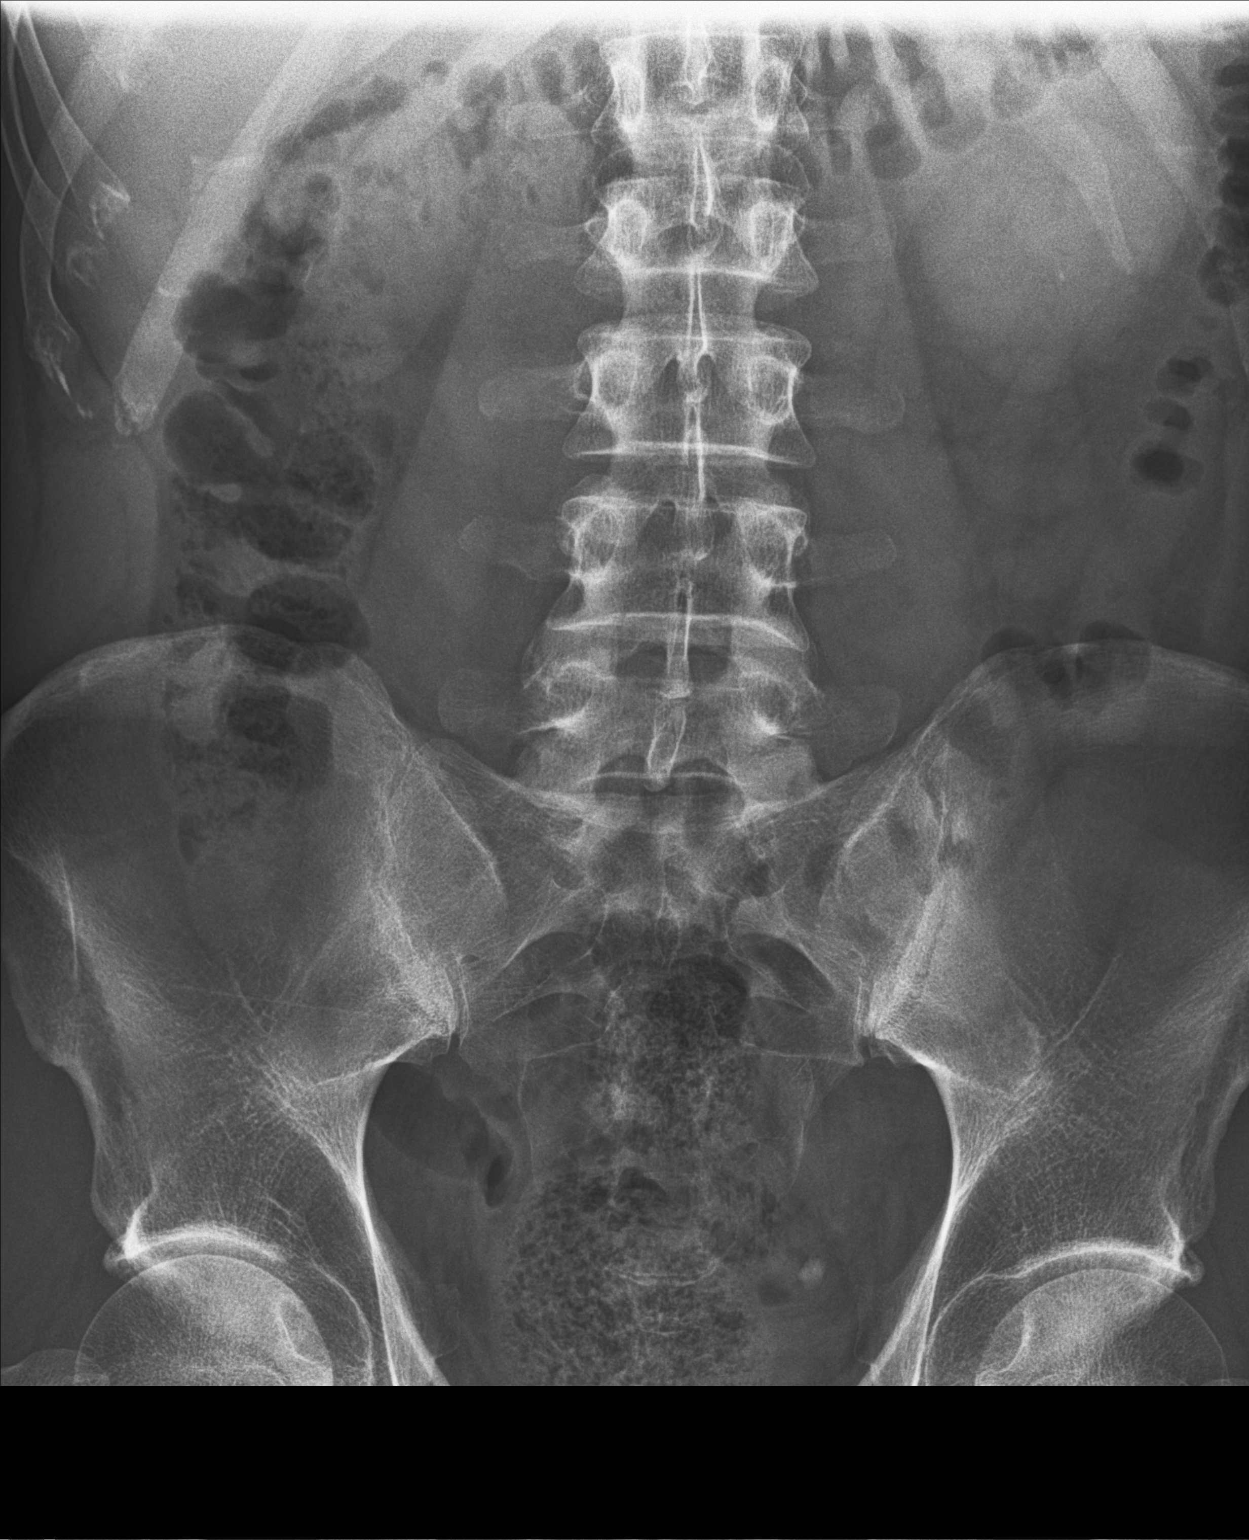

[2 of 2 positions shown; findings below may reference images not displayed]

FINDINGS: There is moderate stool in the colon and rectum. No bowel dilatation
or air-fluid level to suggest bowel obstruction. No free air. Lung
bases are clear. Probable phlebolith left pelvis.
IMPRESSION: No bowel obstruction or free air evident on supine examination. Lung
bases clear.

## 2022-07-03 ENCOUNTER — Encounter: Payer: Self-pay | Admitting: Family Medicine

## 2022-07-03 ENCOUNTER — Telehealth: Payer: Managed Care, Other (non HMO) | Admitting: Family Medicine

## 2022-07-03 DIAGNOSIS — A084 Viral intestinal infection, unspecified: Secondary | ICD-10-CM | POA: Diagnosis not present

## 2022-07-03 MED ORDER — ONDANSETRON 4 MG PO TBDP: 4.0000 mg | ORAL_TABLET | Freq: Three times a day (TID) | ORAL | 0 refills | Status: DC | PRN

## 2022-07-03 NOTE — Progress Notes (Signed)
   Virtual Visit via Telephone Note  I connected with James Sosa on 07/03/22 at 4:30 PM by telephone and verified that I am speaking with the correct person using two identifiers. James Sosa is currently located at home and nobody is currently with him during this visit. The provider, Gwenlyn Fudge, FNP is located in their office at time of visit.  I discussed the limitations, risks, security and privacy concerns of performing an evaluation and management service by telephone and the availability of in person appointments. I also discussed with the patient that there may be a patient responsible charge related to this service. The patient expressed understanding and agreed to proceed.  Subjective: PCP: Gwenlyn Fudge, FNP  Chief Complaint  Patient presents with   GI Problem   Patient reports headache, nausea, and diarrhea that started this morning. He has taken famotidine which he feels slowed down the diarrhea. Tylenol and Ibuprofen relieved his headache.    ROS: Per HPI  Current Outpatient Medications:    albuterol (VENTOLIN HFA) 108 (90 Base) MCG/ACT inhaler, Inhale 2 puffs into the lungs every 6 (six) hours as needed for wheezing or shortness of breath., Disp: , Rfl:    cetirizine (ZYRTEC) 10 MG tablet, Take 10 mg by mouth daily., Disp: , Rfl:    loperamide (IMODIUM A-D) 2 MG tablet, Take 1 tablet (2 mg total) by mouth 4 (four) times daily as needed for diarrhea or loose stools., Disp: 30 tablet, Rfl: 0   ondansetron (ZOFRAN) 4 MG tablet, Take 1 tablet (4 mg total) by mouth every 8 (eight) hours as needed for nausea or vomiting., Disp: 20 tablet, Rfl: 0  No Known Allergies Past Medical History:  Diagnosis Date   Allergy    Asthma    Elevated LDL cholesterol level 12/24/2020   Vasovagal syncope     Observations/Objective: A&O  No respiratory distress or wheezing audible over the phone Mood, judgement, and thought processes all WNL  Assessment and Plan: 1. Viral  gastroenteritis Education provided on viral gastroenteritis. Encouraged adequate hydration. Zofran as needed. Note provided for work.  - ondansetron (ZOFRAN-ODT) 4 MG disintegrating tablet; Take 1 tablet (4 mg total) by mouth every 8 (eight) hours as needed for nausea or vomiting.  Dispense: 20 tablet; Refill: 0   Follow Up Instructions:  I discussed the assessment and treatment plan with the patient. The patient was provided an opportunity to ask questions and all were answered. The patient agreed with the plan and demonstrated an understanding of the instructions.   The patient was advised to call back or seek an in-person evaluation if the symptoms worsen or if the condition fails to improve as anticipated.  The above assessment and management plan was discussed with the patient. The patient verbalized understanding of and has agreed to the management plan. Patient is aware to call the clinic if symptoms persist or worsen. Patient is aware when to return to the clinic for a follow-up visit. Patient educated on when it is appropriate to go to the emergency department.   Time call ended: 4:41 PM  I provided 11 minutes of non-face-to-face time during this encounter.  Deliah Boston, MSN, APRN, FNP-C [facility] 07/03/22

## 2022-07-04 ENCOUNTER — Telehealth: Payer: Self-pay | Admitting: Family Medicine

## 2022-07-04 NOTE — Telephone Encounter (Signed)
Patient is feeling better and would like to return to work on 8/12 instead of 8/17. Would like a new note written. Please call when note is ready.

## 2022-07-04 NOTE — Telephone Encounter (Signed)
Note sent via MyChart.  

## 2023-05-18 ENCOUNTER — Ambulatory Visit (INDEPENDENT_AMBULATORY_CARE_PROVIDER_SITE_OTHER): Payer: Managed Care, Other (non HMO) | Admitting: Nurse Practitioner

## 2023-05-18 ENCOUNTER — Encounter: Payer: Self-pay | Admitting: Nurse Practitioner

## 2023-05-18 VITALS — BP 121/74 | HR 60 | Temp 97.7°F | Ht 76.0 in | Wt 228.2 lb

## 2023-05-18 DIAGNOSIS — J302 Other seasonal allergic rhinitis: Secondary | ICD-10-CM

## 2023-05-18 DIAGNOSIS — Z125 Encounter for screening for malignant neoplasm of prostate: Secondary | ICD-10-CM

## 2023-05-18 DIAGNOSIS — J4520 Mild intermittent asthma, uncomplicated: Secondary | ICD-10-CM

## 2023-05-18 DIAGNOSIS — E7800 Pure hypercholesterolemia, unspecified: Secondary | ICD-10-CM

## 2023-05-18 DIAGNOSIS — Z0000 Encounter for general adult medical examination without abnormal findings: Secondary | ICD-10-CM

## 2023-05-18 DIAGNOSIS — Z0001 Encounter for general adult medical examination with abnormal findings: Secondary | ICD-10-CM

## 2023-05-18 DIAGNOSIS — Z7689 Persons encountering health services in other specified circumstances: Secondary | ICD-10-CM | POA: Insufficient documentation

## 2023-05-18 LAB — CBC WITH DIFFERENTIAL/PLATELET

## 2023-05-18 LAB — CMP14+EGFR
ALT: 18 IU/L (ref 0–44)
AST: 17 IU/L (ref 0–40)
Albumin: 4.7 g/dL (ref 4.1–5.1)
BUN: 12 mg/dL (ref 6–24)
Bilirubin Total: 0.3 mg/dL (ref 0.0–1.2)
CO2: 23 mmol/L (ref 20–29)
Globulin, Total: 2.1 g/dL (ref 1.5–4.5)
Potassium: 4.8 mmol/L (ref 3.5–5.2)

## 2023-05-18 LAB — THYROID PANEL WITH TSH

## 2023-05-18 LAB — LIPID PANEL
LDL Chol Calc (NIH): 123 mg/dL — ABNORMAL HIGH (ref 0–99)
VLDL Cholesterol Cal: 22 mg/dL (ref 5–40)

## 2023-05-18 LAB — BAYER DCA HB A1C WAIVED: HB A1C (BAYER DCA - WAIVED): 5.4 % (ref 4.8–5.6)

## 2023-05-18 MED ORDER — LEVOCETIRIZINE DIHYDROCHLORIDE 5 MG PO TABS: 5.0000 mg | ORAL_TABLET | Freq: Every evening | ORAL | 3 refills | Status: DC

## 2023-05-18 NOTE — Progress Notes (Signed)
Complete physical exam  Patient: James Sosa   DOB: [birth year]   42 y.o. Male  MRN: [id number]  Subjective:    Chief Complaint  Patient presents with   Annual Exam   Establish Care    James Sosa is a 42 y.o. male who presents today for am annual physical exam.  PMH os asthma, allergic rhinitis and high lipid " last asthma attack was 2- yrs ago, I have not use my inhaler, change from Zyrtec to xyzal"  He reports consuming a general diet. Gym/ health club routine includes high impact aerobics , stationary bike, and treadmill. He generally feels well. He reports sleeping well. He does not have additional problems to discuss today.  Family history of colon cancer, will consider colonoscopy next year, as he reports having GI for over 1-yr " I will to spell of diarrhea, at least monthly, nothing alarming" Vape 1 cartdrige weekly " I hope the one that I have now will be my last one"  Due for Dtap but wants to wait  Most recent fall risk assessment:    12/21/2020    1:13 PM  Fall Risk   Falls in the past year? 0     Most recent depression screenings:    05/18/2023   10:36 AM 12/21/2020    1:13 PM  PHQ 2/9 Scores  PHQ - 2 Score 1 0  PHQ- 9 Score 2     Vision:Within last year and Dental: No current dental problems  Patient Active Problem List   Diagnosis Date Noted   Screening PSA (prostate specific antigen) 05/18/2023   Routine medical exam 05/18/2023   Encounter to establish care 05/18/2023   Elevated LDL cholesterol level 12/24/2020   Seasonal allergies 08/04/2019   Asthma    Past Medical History:  Diagnosis Date   Allergy    Asthma    Elevated LDL cholesterol level 12/24/2020   Vasovagal syncope    Past Surgical History:  Procedure Laterality Date   ANKLE SURGERY Left    Social History   Tobacco Use   Smoking status: Every Day    Types: E-cigarettes   Smokeless tobacco: Never  Vaping Use   Vaping Use: Every day  Substance Use Topics   Alcohol use:  Yes    Comment: occ   Drug use: Never   Family Status  Relation Name Status   Mother  Alive   Father  Alive   MGM  Deceased   MGF  Deceased   PGM  Deceased   PGF  Deceased   Son  Alive   Son  Alive   Family History  Problem Relation Age of Onset   Thyroid disease Mother    Diverticulitis Mother    Heart disease Mother    Heart attack Mother    Diabetes Father    Colon cancer Maternal Grandmother        early 44s   Asthma Son    Asthma Son       Patient Care Team: Evern Bio, Dois Davenport, NP as PCP - General (Nurse Practitioner)   Outpatient Medications Prior to Visit  Medication Sig   albuterol (VENTOLIN HFA) 108 (90 Base) MCG/ACT inhaler Inhale 2 puffs into the lungs every 6 (six) hours as needed for wheezing or shortness of breath.   ondansetron (ZOFRAN-ODT) 4 MG disintegrating tablet Take 1 tablet (4 mg total) by mouth every 8 (eight) hours as needed for nausea or vomiting.   [DISCONTINUED] cetirizine (ZYRTEC)  10 MG tablet Take 10 mg by mouth daily.   No facility-administered medications prior to visit.    ROS Negative unless indicated in HPI    Objective:     BP 121/74   Pulse 60   Temp 97.7 F (36.5 C) (Temporal)   Ht 6\' 4"  (1.93 m)   Wt 228 lb 3.2 oz (103.5 kg)   SpO2 96%   BMI 27.78 kg/m  BP Readings from Last 3 Encounters:  05/18/23 121/74  12/21/20 124/80  08/30/20 128/79   Wt Readings from Last 3 Encounters:  05/18/23 228 lb 3.2 oz (103.5 kg)  12/21/20 210 lb (95.3 kg)  08/30/20 209 lb (94.8 kg)      Physical Exam Vitals and nursing note reviewed.  Constitutional:      Appearance: Normal appearance. He is normal weight.  HENT:     Head: Normocephalic and atraumatic.  Eyes:     General: No scleral icterus.    Conjunctiva/sclera: Conjunctivae normal.     Pupils: Pupils are equal, round, and reactive to light.  Neck:     Vascular: No carotid bruit.  Cardiovascular:     Rate and Rhythm: Normal rate.     Heart sounds: Normal  heart sounds.  Pulmonary:     Effort: Pulmonary effort is normal.     Breath sounds: Normal breath sounds.  Abdominal:     General: Bowel sounds are normal. There is no distension.     Palpations: Abdomen is soft.     Tenderness: There is no right CVA tenderness, left CVA tenderness or guarding.     Hernia: No hernia is present.  Musculoskeletal:        General: Normal range of motion.     Cervical back: Normal range of motion and neck supple.     Right lower leg: No edema.     Left lower leg: No edema.  Lymphadenopathy:     Cervical: No cervical adenopathy.  Skin:    General: Skin is warm and dry.  Neurological:     General: No focal deficit present.     Mental Status: He is alert and oriented to person, place, and time. Mental status is at baseline.  Psychiatric:        Mood and Affect: Mood normal.        Behavior: Behavior normal.        Thought Content: Thought content normal.        Judgment: Judgment normal.      No results found for any visits on 05/18/23. Last CBC Lab Results  Component Value Date   WBC 7.2 12/21/2020   HGB 13.7 12/21/2020   HCT 41.4 12/21/2020   MCV 82 12/21/2020   MCH 27.0 12/21/2020   RDW 12.5 12/21/2020   PLT 181 12/21/2020   Last metabolic panel Lab Results  Component Value Date   GLUCOSE 88 12/21/2020   NA 140 12/21/2020   K 4.6 12/21/2020   CL 103 12/21/2020   CO2 25 12/21/2020   BUN 12 12/21/2020   CREATININE 0.89 12/21/2020   GFRNONAA 106 12/21/2020   CALCIUM 9.8 12/21/2020   PROT 7.1 12/21/2020   ALBUMIN 4.9 12/21/2020   LABGLOB 2.2 12/21/2020   AGRATIO 2.2 12/21/2020   BILITOT 0.4 12/21/2020   ALKPHOS 64 12/21/2020   AST 15 12/21/2020   ALT 14 12/21/2020   Last lipids Lab Results  Component Value Date   CHOL 182 12/21/2020   HDL 44 12/21/2020   LDLCALC  125 (H) 12/21/2020   TRIG 67 12/21/2020   CHOLHDL 4.1 12/21/2020        Assessment & Plan:    Routine Health Maintenance and Physical Exam  Discussed  health benefits of physical activity, and encouraged him to engage in regular exercise appropriate for his age and condition.  Seasonal allergies -     Levocetirizine Dihydrochloride; Take 1 tablet (5 mg total) by mouth every evening.  Dispense: 90 tablet; Refill: 3  Annual physical exam -     CBC with Differential/Platelet -     CMP14+EGFR -     Lipid panel -     Thyroid Panel With TSH -     Bayer DCA Hb A1c Waived  Mild intermittent asthma without complication -     CBC with Differential/Platelet -     CMP14+EGFR  Elevated LDL cholesterol level -     Lipid panel  Screening PSA (prostate specific antigen)  Screening for prostate cancer -     PSA, total and free  Healthy 42 yrs old male Mild intermittent asthma well controlled, continue Ventolin as PRN for exacerbation Allergic rhinitis well controled  with OTC Levocetirizine/Xyzal  I the process of quitting vaping Labs: CBC, CMP, Lipid, PSA, A1C Next physical will get colonoscopy  Return in about 1 year (around 05/17/2024) for follow-up.     [id number] Santa Lighter DNP

## 2023-05-19 LAB — CBC WITH DIFFERENTIAL/PLATELET
Basophils Absolute: 0 10*3/uL (ref 0.0–0.2)
EOS (ABSOLUTE): 0.1 10*3/uL (ref 0.0–0.4)
Eos: 2 %
Immature Granulocytes: 0 %
MCH: 26.9 pg (ref 26.6–33.0)
MCHC: 32.7 g/dL (ref 31.5–35.7)
MCV: 82 fL (ref 79–97)
Monocytes: 6 %
Neutrophils: 62 %
RBC: 5.28 x10E6/uL (ref 4.14–5.80)
RDW: 12.4 % (ref 11.6–15.4)
WBC: 6.2 10*3/uL (ref 3.4–10.8)

## 2023-05-19 LAB — THYROID PANEL WITH TSH
T3 Uptake Ratio: 26 % (ref 24–39)
T4, Total: 6.4 ug/dL (ref 4.5–12.0)
TSH: 0.69 u[IU]/mL (ref 0.450–4.500)

## 2023-05-19 LAB — CMP14+EGFR
Alkaline Phosphatase: 66 IU/L (ref 44–121)
BUN/Creatinine Ratio: 11 (ref 9–20)
Calcium: 9.8 mg/dL (ref 8.7–10.2)
Chloride: 105 mmol/L (ref 96–106)
Creatinine, Ser: 1.07 mg/dL (ref 0.76–1.27)
Glucose: 77 mg/dL (ref 70–99)
Sodium: 142 mmol/L (ref 134–144)
Total Protein: 6.8 g/dL (ref 6.0–8.5)
eGFR: 88 mL/min/{1.73_m2} (ref >59)

## 2023-05-19 LAB — LIPID PANEL
Chol/HDL Ratio: 4.8 ratio (ref 0.0–5.0)
Cholesterol, Total: 183 mg/dL (ref 100–199)
HDL: 38 mg/dL — ABNORMAL LOW (ref >39)
Triglycerides: 123 mg/dL (ref 0–149)

## 2023-05-19 LAB — PSA, TOTAL AND FREE
PSA, Free Pct: 45 %
PSA, Free: 0.36 ng/mL
Prostate Specific Ag, Serum: 0.8 ng/mL (ref 0.0–4.0)

## 2023-05-19 NOTE — Progress Notes (Signed)
Lipid panel revealed a low HDL (good cholesterol that protects against stroke and heart attack).  LDL was 123 (bad cholesterol that can put you at increased risk of stroke and heart attack).  Triglycerides were normal (this level correlates with how much sugar/ carbohydrates you eat/drink).  To maintain healthy levels patient should continue to work on diet and exercise.  Eat plenty of fruits and vegetables.  Avoid saturated fats and high carbohydrate foods. Get at least 30 minutes of exercise daily.   A cholesterol medication is not recommended at this time.  Weight management, diet, and exercise are essential. Ideal BMI is 18.5 - 24.9. You need to exercise for at least 30 minutes five times per week. Your diet should include fresh fruits and vegetables, fiber-rich whole grains, lean meats such as poultry and fatty fish, fat free or 1% dairy products, foods low in saturated and trans fats, foods low in cholesterol and added sugars, and foods low in sodium. You should use coconut or olive oil rather than vegetable oil. You should avoid fried foods. We will recheck this in 6-12 months to see if improvement has been made with these changes.  All other labs are normal

## 2023-07-21 ENCOUNTER — Telehealth (INDEPENDENT_AMBULATORY_CARE_PROVIDER_SITE_OTHER): Payer: Managed Care, Other (non HMO) | Admitting: Nurse Practitioner

## 2023-07-21 ENCOUNTER — Encounter: Payer: Self-pay | Admitting: Nurse Practitioner

## 2023-07-21 DIAGNOSIS — K591 Functional diarrhea: Secondary | ICD-10-CM | POA: Diagnosis not present

## 2023-07-21 DIAGNOSIS — R110 Nausea: Secondary | ICD-10-CM

## 2023-07-21 MED ORDER — ONDANSETRON HCL 4 MG PO TABS: 4.0000 mg | ORAL_TABLET | Freq: Three times a day (TID) | ORAL | 0 refills | Status: DC | PRN

## 2023-07-21 NOTE — Progress Notes (Signed)
Virtual Visit Consent   James Sosa, you are scheduled for a virtual visit with Mary-Margaret Daphine Deutscher, FNP, a [facility] provider, today.     Just as with appointments in the office, your consent must be obtained to participate.  Your consent will be active for this visit and any virtual visit you may have with one of our providers in the next 365 days.     If you have a MyChart account, a copy of this consent can be sent to you electronically.  All virtual visits are billed to your insurance company just like a traditional visit in the office.    As this is a virtual visit, video technology does not allow for your provider to perform a traditional examination.  This may limit your provider's ability to fully assess your condition.  If your provider identifies any concerns that need to be evaluated in person or the need to arrange testing (such as labs, EKG, etc.), we will make arrangements to do so.     Although advances in technology are sophisticated, we cannot ensure that it will always work on either your end or our end.  If the connection with a video visit is poor, the visit may have to be switched to a telephone visit.  With either a video or telephone visit, we are not always able to ensure that we have a secure connection.     I need to obtain your verbal consent now.   Are you willing to proceed with your visit today? YES   TATSUO MCNELL has provided verbal consent on 07/21/2023 for a virtual visit (video or telephone).   Mary-Margaret Daphine Deutscher, FNP   Date: 07/21/2023 2:01 PM   Virtual Visit via Video Note   I, Mary-Margaret Daphine Deutscher, connected with James Sosa ([id number], January 09, 1981) on 07/21/23 at  4:30 PM EDT by a video-enabled telemedicine application and verified that I am speaking with the correct person using two identifiers.  Location: Patient: Virtual Visit Location Patient: Home Provider: Virtual Visit Location Provider: Mobile   I discussed the limitations of  evaluation and management by telemedicine and the availability of in person appointments. The patient expressed understanding and agreed to proceed.    History of Present Illness: James Sosa is a 42 y.o. who identifies as a male who was assigned male at birth, and is being seen today for nausea and diarrhea.  HPI: Patient woke up this morning with nausea and diarrhea. The nausea is some better but still has diarrhea. No change on appetite. Has taken imodium which has helped some.     Review of Systems  Constitutional: Negative.   Cardiovascular: Negative.   Gastrointestinal:  Positive for diarrhea and nausea. Negative for blood in stool and vomiting.    Problems:  Patient Active Problem List   Diagnosis Date Noted   Screening PSA (prostate specific antigen) 05/18/2023   Routine medical exam 05/18/2023   Encounter to establish care 05/18/2023   Elevated LDL cholesterol level 12/24/2020   Seasonal allergies 08/04/2019   Asthma     Allergies: No Known Allergies Medications:  Current Outpatient Medications:    albuterol (VENTOLIN HFA) 108 (90 Base) MCG/ACT inhaler, Inhale 2 puffs into the lungs every 6 (six) hours as needed for wheezing or shortness of breath., Disp: , Rfl:    levocetirizine (XYZAL) 5 MG tablet, Take 1 tablet (5 mg total) by mouth every evening., Disp: 90 tablet, Rfl: 3   ondansetron (ZOFRAN-ODT) 4 MG disintegrating  tablet, Take 1 tablet (4 mg total) by mouth every 8 (eight) hours as needed for nausea or vomiting., Disp: 20 tablet, Rfl: 0  Observations/Objective: Patient is well-developed, well-nourished in no acute distress.  Resting comfortably  at home.  Head is normocephalic, atraumatic.  No labored breathing.  Speech is clear and coherent with logical content.  Patient is alert and oriented at baseline.    Assessment and Plan:  James Sosa in today with chief complaint of nausea and diarrhea   1. Functional diarrhea Force fluids Imodium AD  OTC  2. Nausea First 24 Hours-Clear liquids  popsicles  Jello  gatorade  Sprite Second 24 hours-Add Full liquids ( Liquids you cant see through) Third 24 hours- Bland diet ( foods that are baked or broiled)  *avoiding fried foods and highly spiced foods* During these 3 days  Avoid milk, cheese, ice cream or any other dairy products  Avoid caffeine- REMEMBER Mt. Dew and Mello Yellow contain lots of caffeine You should eat and drink in  Frequent small volumes If no improvement in symptoms or worsen in 2-3 days should RETRUN TO OFFICE or go to ER!    - ondansetron (ZOFRAN) 4 MG tablet; Take 1 tablet (4 mg total) by mouth every 8 (eight) hours as needed for nausea or vomiting.  Dispense: 20 tablet; Refill: 0     Follow Up Instructions: I discussed the assessment and treatment plan with the patient. The patient was provided an opportunity to ask questions and all were answered. The patient agreed with the plan and demonstrated an understanding of the instructions.  A copy of instructions were sent to the patient via MyChart.  The patient was advised to call back or seek an in-person evaluation if the symptoms worsen or if the condition fails to improve as anticipated.  Time:  I spent 8 minutes with the patient via telehealth technology discussing the above problems/concerns.    Mary-Margaret Daphine Deutscher, FNP

## 2023-07-21 NOTE — Patient Instructions (Signed)
  James Sosa, thank you for joining James Pierini, FNP for today's virtual visit.  While this provider is not your primary care provider (PCP), if your PCP is located in our provider database this encounter information will be shared with them immediately following your visit.   Sosa James Sosa gives you access to today's visit and all your visits, tests, and labs performed at [facility] " click here if you don't have Sosa James Sosa Sosa or go to [email or web address]  Consent: (Patient) James Sosa provided verbal consent for this virtual visit at the beginning of the encounter.  Current Medications:  Current Outpatient Medications:    ondansetron (ZOFRAN) 4 MG tablet, Take 1 tablet (4 mg total) by mouth every 8 (eight) hours as needed for nausea or vomiting., Disp: 20 tablet, Rfl: 0   albuterol (VENTOLIN HFA) 108 (90 Base) MCG/ACT inhaler, Inhale 2 puffs into the lungs every 6 (six) hours as needed for wheezing or shortness of breath., Disp: , Rfl:    levocetirizine (XYZAL) 5 MG tablet, Take 1 tablet (5 mg total) by mouth every evening., Disp: 90 tablet, Rfl: 3   ondansetron (ZOFRAN-ODT) 4 MG disintegrating tablet, Take 1 tablet (4 mg total) by mouth every 8 (eight) hours as needed for nausea or vomiting., Disp: 20 tablet, Rfl: 0   Medications ordered in this encounter:  Meds ordered this encounter  Medications   ondansetron (ZOFRAN) 4 MG tablet    Sig: Take 1 tablet (4 mg total) by mouth every 8 (eight) hours as needed for nausea or vomiting.    Dispense:  20 tablet    Refill:  0    Order Specific Question:   Supervising Provider    Answer:   James Sosa [[id number]]     *If you need refills on other medications prior to your next appointment, please contact your pharmacy*  Follow-Up: Call back or seek an in-person evaluation if the symptoms worsen or if the condition fails to improve as anticipated.  [facility]  [facility] [phone]  Other Instructions First 24 Hours-Clear liquids  popsicles  Jello  gatorade  Sprite Second 24 hours-Add Full liquids ( Liquids you cant see through) Third 24 hours- Bland diet ( foods that are baked or broiled)  *avoiding fried foods and highly spiced foods* During these 3 days  Avoid milk, cheese, ice cream or any other dairy products  Avoid caffeine- REMEMBER Mt. Dew and Mello Yellow contain lots of caffeine You should eat and drink in  Frequent small volumes If no improvement in symptoms or worsen in 2-3 days should RETRUN TO OFFICE or go to ER!      If you have been instructed to have an in-person evaluation today at Sosa local Urgent Care facility, please use the link below. It will take you to Sosa list of all of our available [facility], including address, phone number and hours of operation. Please do not delay care.  [facility]  If you or Sosa family member do not have Sosa primary care provider, use the link below to schedule [facility] visit and establish care. When you choose [facility] primary care physician or advanced practice provider, you gain Sosa long-term partner in health. Find [facility] Primary Care Provider  Learn more about [facility] in-office and virtual care options:  - Get Care Now

## 2023-12-02 ENCOUNTER — Ambulatory Visit: Payer: Self-pay | Admitting: Nurse Practitioner

## 2023-12-02 NOTE — Telephone Encounter (Signed)
 Copied from CRM [id number]. Topic: Clinical - Red Word Triage >> Dec 02, 2023  2:37 PM Laurier C wrote: Red Word that prompted transfer to Nurse Triage: Patient thinks he may have nicotine posinion he placed nicotine patches yesterday and patient has been feeling the following Symptoms: Lightheaded, dizziness   Chief Complaint: Possible reaction to a nicotine patch Symptoms: Rash, itching, lightheadedness, headache, nausea---all resolved except for rash still present Frequency: this morning Pertinent Negatives: Patient denies dizziness, lightheaded  Disposition: [] ED /[] Urgent Care (no appt availability in office) / [x] Appointment(In office/virtual)/ []  [facility]/ [] Home Care/ [] Refused Recommended Disposition /[] [facility]/ []  Follow-up with PCP Additional Notes: Patient called and advised that he got some nicotine patches and believes he had a reaction to them. He denies any known allergies to adhesive or bandaids that he is aware of.  He states that they are the first Step 21mg  Nicotine patches. Patient states that he put one on yesterday and was fine all day--he is trying to quit vaping.  Patient states that when he got up 2:30am he felt light headed and dizzy and experienced some nausea did not go to work today. Patient took the patch off at this time and noticed a rash underneath.  Rash is still present. Patient states that the rash had hives and itching all around it.  Patient denies any difficulty breathing or swollen lips or tongue or concerns for airway compromise.  Patient took some Famotidine and states the nausea is now gone.  Patient laid back down and did not go to work today.  Patient got up at 7am and dizziness/light headedness was gone and he just had a slight headache.  Patient states he laid back down, got up about an hour or so later and felt back to normal with no symptoms except for the existing rash.  Patient would like to have this rash evaluated and also  discuss other options for nicotine cessation with his PCP.  Patient is advised of home care advice and advised that if anything worsens to go to the emergency room.  Patient verbalized understanding. Appointment was made for tomorrow 12/03/2023 at 9:45am at the office with his PCP.              Reason for Disposition  Medicine patch causing local rash or itching  Answer Assessment - Initial Assessment Questions 1. NAME of MEDICINE: What medicine(s) are you calling about?     Nicotine patch 2. QUESTION: What is your question? (e.g., double dose of medicine, side effect)     Possible allergic reaction---all symptoms are gone at this time rash still slightly present with no hives and no itching. 3. PRESCRIBER: Who prescribed the medicine? Reason: if prescribed by specialist, call should be referred to that group.     Over the counter nicotine patch 4. SYMPTOMS: Do you have any symptoms? If Yes, ask: What symptoms are you having?  How bad are the symptoms (e.g., mild, moderate, severe)     Patient denies all symptoms but states that the rash is still slightly there.  Protocols used: Medication Question Call-A-AH

## 2023-12-03 ENCOUNTER — Encounter: Payer: Self-pay | Admitting: Nurse Practitioner

## 2023-12-03 ENCOUNTER — Ambulatory Visit (INDEPENDENT_AMBULATORY_CARE_PROVIDER_SITE_OTHER): Payer: Managed Care, Other (non HMO) | Admitting: Nurse Practitioner

## 2023-12-03 VITALS — BP 113/74 | HR 71 | Temp 97.5°F | Ht 76.0 in | Wt 220.6 lb

## 2023-12-03 DIAGNOSIS — R110 Nausea: Secondary | ICD-10-CM | POA: Diagnosis not present

## 2023-12-03 DIAGNOSIS — Z716 Tobacco abuse counseling: Secondary | ICD-10-CM | POA: Diagnosis not present

## 2023-12-03 MED ORDER — BUPROPION HCL ER (SR) 150 MG PO TB12: 150.0000 mg | ORAL_TABLET | Freq: Two times a day (BID) | ORAL | 2 refills | Status: DC

## 2023-12-03 MED ORDER — ONDANSETRON HCL 4 MG PO TABS: 4.0000 mg | ORAL_TABLET | Freq: Three times a day (TID) | ORAL | 0 refills | Status: DC | PRN

## 2023-12-03 NOTE — Progress Notes (Signed)
 Established Patient Office Visit  Subjective  Patient ID: James Sosa, male    DOB: Feb 20, 1981  Age: 43 y.o. MRN: [id number]  Chief Complaint  Patient presents with   nicotine patch reaction    Accidentally left nicotine patch on overnight, woke up next morning dizzy and nauseous. Took patch off and had rash. Got better throughout day but had headache as well    HPI James Sosa 43 year old male present 12/03/2023 concern for possible nicotine poisoning.Presenting with concerns about possible nicotine poisoning. The patient reports purchasing nicotine patches online through [employer] and began using it while continuing to vape nicotine multiple times daily over the past 4-days.  He is unsure of the exact amount.  Two days ago, the patient started experiencing symptoms including nausea, vomiting, and headache, which have been persistent but gradually improving, but still feeling nauseous from time to times. The patient denies any chest pain, shortness of breath, dizziness, or confusion. The symptoms have since improved, and the patient now reports feeling better. The patient is seeking evaluation due to concerns about the combined nicotine use and the potential for toxicity.  He has been smoking for 10 years, currently vaping nicotine 6-8 times per day.  Tried to quit smoking on multiple occasions with nicotine patches, but none were successful. He is interested in smoking cessation and specifically in using Wellbutrin  as a potential aid. He is motivated to quit due to Father smokes for almost passed for cancer. The patient is asking about the efficacy, side effects, and potential benefits of Wellbutrin , and whether it would be appropriate for them given his recent issue with using patches and continuing vaping.  Denied the use of EtOH or other illicit drugs   Patient Active Problem List   Diagnosis Date Noted   Encounter for smoking cessation counseling 12/03/2023   Screening PSA (prostate  specific antigen) 05/18/2023   Routine medical exam 05/18/2023   Encounter to establish care 05/18/2023   Elevated LDL cholesterol level 12/24/2020   Seasonal allergies 08/04/2019   Asthma    Past Medical History:  Diagnosis Date   Allergy    Asthma    Elevated LDL cholesterol level 12/24/2020   Vasovagal syncope    Past Surgical History:  Procedure Laterality Date   ANKLE SURGERY Left    Social History   Tobacco Use   Smoking status: Every Day    Types: E-cigarettes   Smokeless tobacco: Never  Vaping Use   Vaping status: Every Day  Substance Use Topics   Alcohol use: Yes    Comment: occ   Drug use: Never   Social History   Socioeconomic History   Marital status: Single    Spouse name: Not on file   Number of children: Not on file   Years of education: Not on file   Highest education level: Not on file  Occupational History   Not on file  Tobacco Use   Smoking status: Every Day    Types: E-cigarettes   Smokeless tobacco: Never  Vaping Use   Vaping status: Every Day  Substance and Sexual Activity   Alcohol use: Yes    Comment: occ   Drug use: Never   Sexual activity: Not on file  Other Topics Concern   Not on file  Social History Narrative   Not on file   Social Drivers of Health   Financial Resource Strain: Not on file  Food Insecurity: Not on file  Transportation Needs: Not on file  Physical Activity: Not on file  Stress: Not on file  Social Connections: Not on file  Intimate Partner Violence: Not on file   Family Status  Relation Name Status   Mother  Alive   Father  Alive   MGM  Deceased   MGF  Deceased   PGM  Deceased   PGF  Deceased   Son  Alive   Son  Alive  No partnership data on file   Family History  Problem Relation Age of Onset   Thyroid  disease Mother    Diverticulitis Mother    Heart disease Mother    Heart attack Mother    Diabetes Father    Colon cancer Maternal Grandmother        early 3s   Asthma Son    Asthma  Son    No Known Allergies    ROS  Constitutional: No weight changes, fever, or fatigue. Eyes: No vision changes, no eye pain. ENT: No sore throat, nasal congestion, or hearing issues. Cardiovascular: No chest pain, palpitations, or edema. Respiratory: No shortness of breath, wheezing, or cough. GI: No nausea, vomiting, diarrhea, or abdominal pain. GU: No dysuria, hematuria, or incontinence. Musculoskeletal: No joint pain, swelling, or muscle weakness. Neurological: No headaches, dizziness, or seizures. Psychiatric: No symptoms of depression, anxiety, or mood swings, though patient expresses stress related to smoking cessation. Endocrine: No excessive thirst, urination, or heat/cold intolerance. Hematologic: No easy bruising or bleeding. Allergic/Immunologic: No known allergies or recent infections.    Objective:     BP 113/74   Pulse 71   Temp (!) 97.5 F (36.4 C) (Temporal)   Ht 6' 4 (1.93 m)   Wt 220 lb 9.6 oz (100.1 kg)   SpO2 100%   BMI 26.85 kg/m  BP Readings from Last 3 Encounters:  12/03/23 113/74  05/18/23 121/74  12/21/20 124/80   Wt Readings from Last 3 Encounters:  12/03/23 220 lb 9.6 oz (100.1 kg)  05/18/23 228 lb 3.2 oz (103.5 kg)  12/21/20 210 lb (95.3 kg)      Physical Exam Vitals and nursing note reviewed.  Constitutional:      General: He is not in acute distress.    Appearance: Normal appearance. He is overweight.  HENT:     Head: Normocephalic and atraumatic.     Nose: Nose normal.     Mouth/Throat:     Mouth: Mucous membranes are moist.  Eyes:     General: No scleral icterus.    Extraocular Movements: Extraocular movements intact.     Conjunctiva/sclera: Conjunctivae normal.     Pupils: Pupils are equal, round, and reactive to light.  Cardiovascular:     Rate and Rhythm: Normal rate and regular rhythm.  Pulmonary:     Effort: Pulmonary effort is normal.     Breath sounds: Normal breath sounds.  Musculoskeletal:        General:  Normal range of motion.     Right lower leg: No edema.     Left lower leg: No edema.  Skin:    General: Skin is warm and dry.     Findings: No rash.  Neurological:     Mental Status: He is alert and oriented to person, place, and time. Mental status is at baseline.  Psychiatric:        Mood and Affect: Mood normal.        Behavior: Behavior normal.        Thought Content: Thought content normal.  Judgment: Judgment normal.      No results found for any visits on 12/03/23.  Last CBC Lab Results  Component Value Date   WBC 6.2 05/18/2023   HGB 14.2 05/18/2023   HCT 43.4 05/18/2023   MCV 82 05/18/2023   MCH 26.9 05/18/2023   RDW 12.4 05/18/2023   PLT 153 05/18/2023   Last metabolic panel Lab Results  Component Value Date   GLUCOSE 77 05/18/2023   NA 142 05/18/2023   K 4.8 05/18/2023   CL 105 05/18/2023   CO2 23 05/18/2023   BUN 12 05/18/2023   CREATININE 1.07 05/18/2023   EGFR 88 05/18/2023   CALCIUM 9.8 05/18/2023   PROT 6.8 05/18/2023   ALBUMIN 4.7 05/18/2023   LABGLOB 2.1 05/18/2023   AGRATIO 2.2 12/21/2020   BILITOT 0.3 05/18/2023   ALKPHOS 66 05/18/2023   AST 17 05/18/2023   ALT 18 05/18/2023   Last lipids Lab Results  Component Value Date   CHOL 183 05/18/2023   HDL 38 (L) 05/18/2023   LDLCALC 123 (H) 05/18/2023   TRIG 123 05/18/2023   CHOLHDL 4.8 05/18/2023   Last hemoglobin A1c Lab Results  Component Value Date   HGBA1C 5.4 05/18/2023   Last thyroid  functions Lab Results  Component Value Date   TSH 0.690 05/18/2023   T4TOTAL 6.4 05/18/2023        Assessment & Plan:  Encounter for smoking cessation counseling -     buPROPion  HCl ER (SR); Take 1 tablet (150 mg total) by mouth 2 (two) times daily.  Dispense: 60 tablet; Refill: 2   James Sosa is a 43 year old Caucasian male seen today for smoking cessation, no acute distress Discuss smoking cessation options, including behavioral strategies, nicotine replacement therapy, and  pharmacotherapy such as Wellbutrin . Assess for contraindications to Wellbutrin  use  history of seizures, eating disorders, or alcohol/substance abuse. Smoking cessation: Start Wellbutrin  150 mg twice daily; emphasizing the importance of behavioral support such as all the care of the floor therapy currently throughout kidney 1 second okay Nausea: Zofran  4 mg 3 times daily as needed  Encourage healthy lifestyle choices, including diet (rich in fruits, vegetables, and lean proteins, and low in salt and simple carbohydrates) and exercise (at least 30 minutes of moderate physical activity daily).     The above assessment and management plan was discussed with the patient. The patient verbalized understanding of and has agreed to the management plan. Patient is aware to call the clinic if they develop any new symptoms or if symptoms persist or worsen. Patient is aware when to return to the clinic for a follow-up visit. Patient educated on when it is appropriate to go to the emergency department.  Return in about 3 months (around 03/02/2024), or if symptoms worsen or fail to improve, for smoking cessation.    James Peyser [place] Thompson, DNP [facility] [address] [place], [place] [address] [phone]    Note: This document was prepared by [employer] voice dictation technology and any errors that results from this process are unintentional.

## 2024-02-27 ENCOUNTER — Other Ambulatory Visit: Payer: Self-pay | Admitting: Nurse Practitioner

## 2024-02-27 DIAGNOSIS — Z716 Tobacco abuse counseling: Secondary | ICD-10-CM

## 2024-03-03 ENCOUNTER — Ambulatory Visit: Payer: Managed Care, Other (non HMO) | Admitting: Nurse Practitioner

## 2024-03-03 ENCOUNTER — Encounter: Payer: Self-pay | Admitting: Nurse Practitioner

## 2024-03-03 VITALS — BP 134/80 | HR 65 | Temp 97.4°F | Ht 76.0 in | Wt 228.4 lb

## 2024-03-03 DIAGNOSIS — Z803 Family history of malignant neoplasm of breast: Secondary | ICD-10-CM

## 2024-03-03 DIAGNOSIS — T22111A Burn of first degree of right forearm, initial encounter: Secondary | ICD-10-CM | POA: Diagnosis not present

## 2024-03-03 DIAGNOSIS — Z800 Family history of malignant neoplasm of digestive organs: Secondary | ICD-10-CM

## 2024-03-03 DIAGNOSIS — J4520 Mild intermittent asthma, uncomplicated: Secondary | ICD-10-CM

## 2024-03-03 DIAGNOSIS — Z716 Tobacco abuse counseling: Secondary | ICD-10-CM

## 2024-03-03 DIAGNOSIS — Z23 Encounter for immunization: Secondary | ICD-10-CM

## 2024-03-03 DIAGNOSIS — Z1211 Encounter for screening for malignant neoplasm of colon: Secondary | ICD-10-CM

## 2024-03-03 DIAGNOSIS — J302 Other seasonal allergic rhinitis: Secondary | ICD-10-CM

## 2024-03-03 MED ORDER — MUPIROCIN 2 % EX OINT: 1.0000 | TOPICAL_OINTMENT | Freq: Two times a day (BID) | CUTANEOUS | 1 refills | Status: DC

## 2024-03-03 MED ORDER — ALBUTEROL SULFATE HFA 108 (90 BASE) MCG/ACT IN AERS: 2.0000 | INHALATION_SPRAY | Freq: Four times a day (QID) | RESPIRATORY_TRACT | 1 refills | Status: DC | PRN

## 2024-03-03 MED ORDER — BUPROPION HCL ER (SR) 150 MG PO TB12: 150.0000 mg | ORAL_TABLET | Freq: Two times a day (BID) | ORAL | 0 refills | Status: DC

## 2024-03-03 NOTE — Progress Notes (Signed)
 Established Patient Office Visit  Subjective  Patient ID: James Sosa, male    DOB: December 20, 1980  Age: 43 y.o. MRN: [id number]  Chief Complaint  Patient presents with   Medical Management of Chronic Issues    3 month - no smoking/vaping since February no nicotine gum in 2 weeks     HPI James Sosa is 43 yrs old male presents 03/03/2024 for a 33-months f/u for chronic diseases management  Superficial Burn right Forearm Initial Encounter: A 43 year old male presents with a superficial burn on his right forearm sustained from a grill. The patient describes the injury as having tested his reflexes, and he notes that he "lost" in the process. The burn has been healing without significant pain, and he denies any systemic symptoms such as fever or malaise. The scab from the burn fell off two days ago. He has been applying triple antibiotic ointment to the affected area, with no signs of infection reported.   Smoking Cessation  43 year old male who was seen on 12/03/2023 for smoking cessation and was started on Wellbutrin and nicotine gum. He reports that he has not smoked since February and has experienced minimal mental cravings. The patient has not used nicotine for over one month. He would like to continue taking Wellbutrin as he feels it has been very helpful in his efforts to quit smoking. He denies any side effects from the medication.  Asthma Follow-up: He has previously been evaluated here for asthma and presents for an asthma follow-up; he is not currently in exacerbation. Symptoms currently include no recent exacerbation . Observed precipitants include no identifiable factor.  Current limitations in activity from asthma: none.  Number of days of school or work missed in the last month: not applicable. Number of Emergency Department visits in the previous month: none. Frequency of use of quick-relief meds: "may be once a month". The patient reports adherence to this regimen   Health  maintenance: Colon cancer screening: He has an extensive family history of cancer colon cancer maternal grandmother, breast cancer mother and father died from blood cancer  Flowsheet Row Office Visit from 03/03/2024 in [facility]  PHQ-9 Total Score 3          03/03/2024    9:58 AM 05/18/2023   10:36 AM  GAD 7 : Generalized Anxiety Score  Nervous, Anxious, on Edge 0 1  Control/stop worrying 0 1  Worry too much - different things 1 0  Trouble relaxing 0 0  Restless 1 0  Easily annoyed or irritable 0 1  Afraid - awful might happen 0 1  Total GAD 7 Score 2 4  Anxiety Difficulty Not difficult at all Not difficult at all     Patient Active Problem List   Diagnosis Date Noted   Family hx of colon cancer 03/03/2024   Screening for colon cancer 03/03/2024   Family history of breast cancer in mother 03/03/2024   Encounter for smoking cessation counseling 12/03/2023   Screening PSA (prostate specific antigen) 05/18/2023   Routine medical exam 05/18/2023   Encounter to establish care 05/18/2023   Elevated LDL cholesterol level 12/24/2020   Seasonal allergies 08/04/2019   Asthma    Past Medical History:  Diagnosis Date   Allergy    Asthma    Elevated LDL cholesterol level 12/24/2020   Vasovagal syncope    Past Surgical History:  Procedure Laterality Date   ANKLE SURGERY Left    Social History  Tobacco Use   Smoking status: Every Day    Types: E-cigarettes   Smokeless tobacco: Never  Vaping Use   Vaping status: Every Day  Substance Use Topics   Alcohol use: Yes    Comment: occ   Drug use: Never   Social History   Socioeconomic History   Marital status: Single    Spouse name: Not on file   Number of children: Not on file   Years of education: Not on file   Highest education level: Not on file  Occupational History   Not on file  Tobacco Use   Smoking status: Every Day    Types: E-cigarettes   Smokeless tobacco: Never   Vaping Use   Vaping status: Every Day  Substance and Sexual Activity   Alcohol use: Yes    Comment: occ   Drug use: Never   Sexual activity: Not on file  Other Topics Concern   Not on file  Social History Narrative   Not on file   Social Drivers of Health   Financial Resource Strain: Not on file  Food Insecurity: Not on file  Transportation Needs: Not on file  Physical Activity: Not on file  Stress: Not on file  Social Connections: Not on file  Intimate Partner Violence: Not on file   Family Status  Relation Name Status   Mother  Alive   Father  Alive   MGM  Deceased   MGF  Deceased   PGM  Deceased   PGF  Deceased   Son  Alive   Son  Alive  No partnership data on file   Family History  Problem Relation Age of Onset   Thyroid disease Mother    Diverticulitis Mother    Heart disease Mother    Heart attack Mother    Diabetes Father    Colon cancer Maternal Grandmother        early 44s   Asthma Son    Asthma Son    No Known Allergies    Review of Systems  Constitutional:  Negative for chills and fever.  Cardiovascular:  Negative for chest pain and leg swelling.  Gastrointestinal:  Negative for abdominal pain, diarrhea, nausea and vomiting.  Skin:  Negative for itching and rash.       Burn on right forearm  Neurological:  Negative for dizziness and headaches.   Negative unless indicated in HPI   Objective:     BP 134/80   Pulse 65   Temp (!) 97.4 F (36.3 C) (Temporal)   Ht 6\' 4"  (1.93 m)   Wt 228 lb 6.4 oz (103.6 kg)   SpO2 100%   BMI 27.80 kg/m  BP Readings from Last 3 Encounters:  03/03/24 134/80  12/03/23 113/74  05/18/23 121/74   Wt Readings from Last 3 Encounters:  03/03/24 228 lb 6.4 oz (103.6 kg)  12/03/23 220 lb 9.6 oz (100.1 kg)  05/18/23 228 lb 3.2 oz (103.5 kg)      Physical Exam Vitals and nursing note reviewed.  Constitutional:      General: He is not in acute distress. HENT:     Head: Normocephalic and atraumatic.      Nose: Nose normal.     Mouth/Throat:     Mouth: Mucous membranes are moist.  Eyes:     General: No scleral icterus.    Extraocular Movements: Extraocular movements intact.     Conjunctiva/sclera: Conjunctivae normal.     Pupils: Pupils are equal, round, and  reactive to light.  Cardiovascular:     Heart sounds: Normal heart sounds.  Pulmonary:     Effort: Pulmonary effort is normal.     Breath sounds: Normal breath sounds.  Musculoskeletal:        General: Normal range of motion.     Right lower leg: No edema.     Left lower leg: No edema.  Skin:    General: Skin is warm and dry.     Findings: Burn present.     Comments: Right arm  Neurological:     Mental Status: He is alert and oriented to person, place, and time. Mental status is at baseline.  Psychiatric:        Mood and Affect: Mood normal.        Behavior: Behavior normal.        Thought Content: Thought content normal.        Judgment: Judgment normal.      No results found for any visits on 03/03/24.  Last CBC Lab Results  Component Value Date   WBC 6.2 05/18/2023   HGB 14.2 05/18/2023   HCT 43.4 05/18/2023   MCV 82 05/18/2023   MCH 26.9 05/18/2023   RDW 12.4 05/18/2023   PLT 153 05/18/2023   Last metabolic panel Lab Results  Component Value Date   GLUCOSE 77 05/18/2023   NA 142 05/18/2023   K 4.8 05/18/2023   CL 105 05/18/2023   CO2 23 05/18/2023   BUN 12 05/18/2023   CREATININE 1.07 05/18/2023   EGFR 88 05/18/2023   CALCIUM 9.8 05/18/2023   PROT 6.8 05/18/2023   ALBUMIN 4.7 05/18/2023   LABGLOB 2.1 05/18/2023   AGRATIO 2.2 12/21/2020   BILITOT 0.3 05/18/2023   ALKPHOS 66 05/18/2023   AST 17 05/18/2023   ALT 18 05/18/2023   Last lipids Lab Results  Component Value Date   CHOL 183 05/18/2023   HDL 38 (L) 05/18/2023   LDLCALC 123 (H) 05/18/2023   TRIG 123 05/18/2023   CHOLHDL 4.8 05/18/2023   Last hemoglobin A1c Lab Results  Component Value Date   HGBA1C 5.4 05/18/2023   Last  thyroid functions Lab Results  Component Value Date   TSH 0.690 05/18/2023   T4TOTAL 6.4 05/18/2023        Assessment & Plan:  Superficial burn of right forearm, initial encounter -     Mupirocin; Apply 1 Application topically 2 (two) times daily.  Dispense: 22 g; Refill: 1  Mild intermittent asthma without complication -     Albuterol Sulfate HFA; Inhale 2 puffs into the lungs every 6 (six) hours as needed for wheezing or shortness of breath.  Dispense: 18 g; Refill: 1  Seasonal allergies  Screening for colon cancer -     Ambulatory referral to Gastroenterology  Family hx of colon cancer -     Ambulatory referral to Gastroenterology  Family history of breast cancer in mother  Encounter for smoking cessation counseling -     buPROPion HCl ER (SR); Take 1 tablet (150 mg total) by mouth 2 (two) times daily.  Dispense: 180 tablet; Refill: 0  Immunization due -     Tdap vaccine greater than or equal to 7yo IM   Mearle is a 43 yrs old Caucasian seen today for chronic diseases management Superficial burn right arm: mupirocin BID  Asthma: well controlled with ventolin, refill provided Smoking cessation: Continue Wellbutrin 150 mg BID, refill provided Seasonal Allergy: continue xyzal, no refill needed Health  Maintenance: refer to GI for colonoscopy, Tdap administered Continue healthy lifestyle choices, including diet (rich in fruits, vegetables, and lean proteins, and low in salt and simple carbohydrates) and exercise (at least 30 minutes of moderate physical activity daily).     The above assessment and management plan was discussed with the patient. The patient verbalized understanding of and has agreed to the management plan. Patient is aware to call the clinic if they develop any new symptoms or if symptoms persist or worsen. Patient is aware when to return to the clinic for a follow-up visit. Patient educated on when it is appropriate to go to the emergency department.  Return  in about 4 months (around 07/03/2024).    Seleena Reimers [place] Thompson, DNP [facility] [address] [place], [place] [address] [phone]    Note: This document was prepared by [employer] voice dictation technology and any errors that results from this process are unintentional.

## 2024-03-09 ENCOUNTER — Encounter: Payer: Self-pay | Admitting: Nurse Practitioner

## 2024-03-09 ENCOUNTER — Ambulatory Visit (INDEPENDENT_AMBULATORY_CARE_PROVIDER_SITE_OTHER): Admitting: Nurse Practitioner

## 2024-03-09 VITALS — BP 116/73 | HR 82 | Temp 97.7°F | Ht 76.0 in | Wt 228.0 lb

## 2024-03-09 DIAGNOSIS — J019 Acute sinusitis, unspecified: Secondary | ICD-10-CM | POA: Diagnosis not present

## 2024-03-09 DIAGNOSIS — J309 Allergic rhinitis, unspecified: Secondary | ICD-10-CM | POA: Diagnosis not present

## 2024-03-09 DIAGNOSIS — B9789 Other viral agents as the cause of diseases classified elsewhere: Secondary | ICD-10-CM | POA: Insufficient documentation

## 2024-03-09 MED ORDER — AZELASTINE HCL 0.1 % NA SOLN: 1.0000 | Freq: Two times a day (BID) | NASAL | 1 refills | Status: DC

## 2024-03-09 NOTE — Progress Notes (Signed)
 Acute Office Visit  Subjective:     Patient ID: James Sosa, male    DOB: Jan 24, 1981, 43 y.o.   MRN: [id number]  Chief Complaint  Patient presents with   Nasal Congestion    Sneezing started yesterday severe headache started this morning    Headache    HPI James Sosa is a 43 y.o. male present 03/09/2024 who complains of congestion, sneezing, dry cough, and HA for 1 days. He denies a history of anorexia, chest pain, chills, dizziness, fevers, and sweats and admits to a history of asthma. Patient denies smoke cigarettes. Quit 60-months ago  Sinusitis: Patient presents with chronic sinusitis. The patient reports chronic sinus infections for 1- days.  His symptoms include sneezing, headaches, itchy eyes.  There has not been a history of fevers, sore throats, mouthbreathing, periorbital venous congestion, spitting/vomiting mucous. There does not have been a history of chronic otitis media or pharyngotonsillitis.  Prior antibiotic therapy has included no recent courses. Other medications have included oral decongestants, Zyzal.  He does not have had allergy testing    Active Ambulatory Problems    Diagnosis Date Noted   Asthma    Seasonal allergies 08/04/2019   Elevated LDL cholesterol level 12/24/2020   Screening PSA (prostate specific antigen) 05/18/2023   Routine medical exam 05/18/2023   Encounter to establish care 05/18/2023   Encounter for smoking cessation counseling 12/03/2023   Family hx of colon cancer 03/03/2024   Screening for colon cancer 03/03/2024   Family history of breast cancer in mother 03/03/2024   Allergic rhinitis 03/09/2024   Resolved Ambulatory Problems    Diagnosis Date Noted   Allergy    Past Medical History:  Diagnosis Date   Vasovagal syncope      Review of Systems  Constitutional:  Negative for chills and fever.  HENT:  Positive for congestion and sinus pain.   Respiratory:  Negative for cough and shortness of breath.   Cardiovascular:   Negative for chest pain and leg swelling.  Gastrointestinal:  Positive for nausea. Negative for constipation, diarrhea and vomiting.       Relieve with zofran  Skin:  Negative for itching and rash.  Neurological:  Positive for headaches.   Negative unless indicated in HPI    Objective:    BP 116/73   Pulse 82   Temp 97.7 F (36.5 C) (Temporal)   Ht 6\' 4"  (1.93 m)   Wt 228 lb (103.4 kg)   SpO2 96%   BMI 27.75 kg/m  BP Readings from Last 3 Encounters:  03/09/24 116/73  03/03/24 134/80  12/03/23 113/74   Wt Readings from Last 3 Encounters:  03/09/24 228 lb (103.4 kg)  03/03/24 228 lb 6.4 oz (103.6 kg)  12/03/23 220 lb 9.6 oz (100.1 kg)      Physical Exam Vitals and nursing note reviewed.  Constitutional:      General: He is not in acute distress. HENT:     Head: Normocephalic and atraumatic.     Right Ear: Tympanic membrane, ear canal and external ear normal. There is no impacted cerumen.     Left Ear: Tympanic membrane, ear canal and external ear normal. There is no impacted cerumen.     Nose: Congestion present.     Right Sinus: Maxillary sinus tenderness and frontal sinus tenderness present.     Left Sinus: Maxillary sinus tenderness and frontal sinus tenderness present.     Mouth/Throat:     Mouth: Mucous membranes are moist.  Eyes:     General: No scleral icterus.    Extraocular Movements: Extraocular movements intact.     Conjunctiva/sclera: Conjunctivae normal.     Pupils: Pupils are equal, round, and reactive to light.  Cardiovascular:     Heart sounds: Normal heart sounds.  Pulmonary:     Effort: Pulmonary effort is normal.     Breath sounds: Normal breath sounds. No wheezing.  Musculoskeletal:        General: Normal range of motion.     Right lower leg: No edema.     Left lower leg: No edema.  Skin:    General: Skin is warm and dry.     Findings: No rash.  Neurological:     Mental Status: He is alert and oriented to person, place, and time.   Psychiatric:        Mood and Affect: Mood normal.        Behavior: Behavior normal.        Thought Content: Thought content normal.        Judgment: Judgment normal.     No results found for any visits on 03/09/24.      Assessment & Plan:  Allergic rhinitis, unspecified seasonality, unspecified trigger -     Azelastine HCl; Place 1 spray into both nostrils 2 (two) times daily. Use in each nostril as directed  Dispense: 30 mL; Refill: 1   [name] is a 43 yrs old male seen today viral sinusitis, acute distress Sinusitis: Astelin BID, continue Xyzal at bedtime 1. Take meds as prescribed 2. Use a cool mist humidifier especially during the winter months and when heat has been humid. 3. Use saline nose sprays frequently. 4. Saline irrigations of the nose can be very helpful if done frequently. Use 4 times daily for one week. Can use Nettie Pot if able to tolerate.  5. Drink plenty of fluids 6. Keep thermostat turned down low. 7.For any cough or congestion: Use plain Mucinex- regular strength or max strength is fine. 8. For fever or aces or pains- take tylenol or ibuprofen appropriate for age and weight, for fevers greater than 101 orally you may alternate ibuprofen and tylenol every 3 hours.    The above assessment and management plan was discussed with the patient. The patient verbalized understanding of and has agreed to the management plan. Patient is aware to call the clinic if they develop any new symptoms or if symptoms persist or worsen. Patient is aware when to return to the clinic for a follow-up visit. Patient educated on when it is appropriate to go to the emergency department.  Return for as already scheduled.  Tiaria Biby [place] Thompson, DNP [facility] [address] [place], [place] [address] [phone]  Note: This document was prepared by [employer] voice dictation technology and any errors that results from this process are unintentional.

## 2024-03-23 ENCOUNTER — Encounter (INDEPENDENT_AMBULATORY_CARE_PROVIDER_SITE_OTHER): Payer: Self-pay | Admitting: *Deleted

## 2024-03-28 ENCOUNTER — Encounter (INDEPENDENT_AMBULATORY_CARE_PROVIDER_SITE_OTHER): Payer: Self-pay | Admitting: *Deleted

## 2024-04-21 ENCOUNTER — Ambulatory Visit: Payer: Self-pay

## 2024-04-21 NOTE — Telephone Encounter (Signed)
  Chief Complaint: upper back/neck pain Symptoms: upper back/neck/left shoulder/left arm pina, numbness and weakness in left hand Frequency: x few weeks, worsened yesterday Pertinent Negatives: Patient denies loss of bowel or bladder control, fever, abdominal pain, blood in urine. Disposition: [] ED /[x] Urgent Care (no appt availability in office) / [] Appointment(In office/virtual)/ []  [facility]/ [] Home Care/ [] Refused Recommended Disposition /[] [facility]/ []  Follow-up with PCP Additional Notes: No available appointments until next week. Patient states he is going out of town tomorrow and needs to be seen today. Called [facility] and confirmed with Chelsea, no appointments available today. Offered telehealth virtual urgent care visit and mobile medicine clinic. Patient states he would prefer to be seen at urgent care.  Copied from CRM [id number]. Topic: Clinical - Red Word Triage >> Apr 21, 2024  1:18 PM Turkey B wrote: [facility] that prompted transfer to Nurse Triage: pt has severe pain in left side of spine, shoulder and neck, upper of of left arm and bicep Reason for Disposition  [1] Numbness in an arm or hand (i.e., loss of sensation) AND [2] upper back pain  Answer Assessment - Initial Assessment Questions 1. ONSET: "When did the pain begin?"      X few weeks, worsened yesterday.  2. LOCATION: "Where does it hurt?" (upper, mid or lower back)     Upper left back/neck.  3. SEVERITY: "How bad is the pain?"  (e.g., Scale 1-10; mild, moderate, or severe)   - MILD (1-3): Doesn't interfere with normal activities.    - MODERATE (4-7): Interferes with normal activities or awakens from sleep.    - SEVERE (8-10): Excruciating pain, unable to do any normal activities.      6-7/10.  4. PATTERN: "Is the pain constant?" (e.g., yes, no; constant, intermittent)      Neck and back constant, left shoulder and arm pain is intermittent.  5. RADIATION: "Does the pain shoot into your  legs or somewhere else?"     Shooting pain in to left shoulder down left arm muscles.  6. CAUSE:  "What do you think is causing the back pain?"      Unsure.  7. BACK OVERUSE:  "Any recent lifting of heavy objects, strenuous work or exercise?"     No.  8. MEDICINES: "What have you taken so far for the pain?" (e.g., nothing, acetaminophen, NSAIDS)     Ibuprofen, Bengay muscle rub.  9. NEUROLOGIC SYMPTOMS: "Do you have any weakness, numbness, or problems with bowel/bladder control?"     Yesterday he had weakness in his left hand and muscle spasms, numbness in fingertips.   10. OTHER SYMPTOMS: "Do you have any other symptoms?" (e.g., fever, abdomen pain, burning with urination, blood in urine)       No.  11. PREGNANCY: "Is there any chance you are pregnant?" "When was your last menstrual period?"       N/A.  Protocols used: Back Pain-A-AH

## 2024-04-26 ENCOUNTER — Ambulatory Visit: Payer: Self-pay

## 2024-04-26 ENCOUNTER — Ambulatory Visit: Admitting: Family Medicine

## 2024-04-26 ENCOUNTER — Encounter: Payer: Self-pay | Admitting: Family Medicine

## 2024-04-26 VITALS — BP 137/73 | HR 56 | Temp 97.9°F | Ht 76.0 in | Wt 255.0 lb

## 2024-04-26 DIAGNOSIS — G542 Cervical root disorders, not elsewhere classified: Secondary | ICD-10-CM

## 2024-04-26 DIAGNOSIS — S46912A Strain of unspecified muscle, fascia and tendon at shoulder and upper arm level, left arm, initial encounter: Secondary | ICD-10-CM

## 2024-04-26 MED ORDER — BETAMETHASONE SOD PHOS & ACET 6 (3-3) MG/ML IJ SUSP: 6.0000 mg | Freq: Once | INTRAMUSCULAR | Status: DC

## 2024-04-26 MED ORDER — PREDNISONE 10 MG PO TABS: ORAL_TABLET | ORAL | 0 refills | Status: DC

## 2024-04-26 NOTE — Progress Notes (Signed)
 Subjective:  Patient ID: James Sosa, male    DOB: 10-Aug-1981  Age: 43 y.o. MRN: [id number]  CC: Shoulder Pain (Around shoulder blade but has a knot that is off and on bother. Over the weekend he woke up and the pain was worse and up into his neck, shooting into shoulder an down into his arm. Pt started having muscle spasms on th under arm and right torso. Numbness in the last 3 digits not. Symptoms keep changing. //* steroid shot, muscle relaxer, prednisone, and ibuprofen given at [facility]. Hot and cold compress. Lats prednisone taken this morning an done more dose of muscle relaxer. )   HPI James Sosa presents for pain for the last several days as noted above.  Symptoms as above reviewed with the patient.  There is some spasm at the second thoracic dermatome on at the base of the neck just to the left.  Of concern is that he has numbness and the thumb and index finger of the left hand remaining.  Some of the discomfort has improved but the numbness remains.  The spasms in the axillary region have resolved.  He has been using prednisone and had a cortisone shot at urgent care 5 days ago.  Although there has been some improvement it is not been significant.     04/26/2024   11:33 AM 03/03/2024    9:58 AM 05/18/2023   10:36 AM  Depression screen PHQ 2/9  Decreased Interest 0 0 0  Down, Depressed, Hopeless 0 0 1  PHQ - 2 Score 0 0 1  Altered sleeping 1 2 0  Tired, decreased energy 1 1 1   Change in appetite 0 0 0  Feeling bad or failure about yourself  0 0 0  Trouble concentrating 0 0 0  Moving slowly or fidgety/restless 0 0 0  Suicidal thoughts 0 0 0  PHQ-9 Score 2 3 2   Difficult doing work/chores Somewhat difficult Not difficult at all Not difficult at all    History James Sosa has a past medical history of Allergy, Asthma, Elevated LDL cholesterol level (12/24/2020), and Vasovagal syncope.   He has a past surgical history that includes Ankle surgery (Left).   His family history includes  Asthma in his son and son; Colon cancer in his maternal grandmother; Diabetes in his father; Diverticulitis in his mother; Heart attack in his mother; Heart disease in his mother; Thyroid  disease in his mother.He reports that he has been smoking e-cigarettes. He has never used smokeless tobacco. He reports current alcohol use. He reports that he does not use drugs.    ROS Review of Systems  Constitutional:  Negative for fever.  Respiratory:  Negative for shortness of breath.   Cardiovascular:  Negative for chest pain.  Musculoskeletal:  Positive for back pain and neck pain. Negative for arthralgias.  Skin:  Negative for rash.    Objective:  BP 137/73   Pulse (!) 56   Temp 97.9 F (36.6 C)   Ht 6\' 4"  (1.93 m)   Wt 255 lb (115.7 kg)   SpO2 94%   BMI 31.04 kg/m   BP Readings from Last 3 Encounters:  04/26/24 137/73  03/09/24 116/73  03/03/24 134/80    Wt Readings from Last 3 Encounters:  04/26/24 255 lb (115.7 kg)  03/09/24 228 lb (103.4 kg)  03/03/24 228 lb 6.4 oz (103.6 kg)     Physical Exam Vitals reviewed.  Constitutional:      Appearance: He is well-developed.  HENT:     Head: Normocephalic and atraumatic.     Right Ear: External ear normal.     Left Ear: External ear normal.     Mouth/Throat:     Pharynx: No oropharyngeal exudate or posterior oropharyngeal erythema.  Eyes:     Pupils: Pupils are equal, round, and reactive to light.  Cardiovascular:     Rate and Rhythm: Normal rate and regular rhythm.     Heart sounds: No murmur heard. Pulmonary:     Effort: No respiratory distress.     Breath sounds: Normal breath sounds.  Musculoskeletal:        General: Tenderness (With spasm, noted at the left T2 region posteriorly.  There is full range of motion some tenderness and discomfort over the posterior deltoid region.  He also has numbness and tingling in the C7 region of the left hand.) present.     Cervical back: Normal range of motion and neck supple.   Neurological:     Mental Status: He is alert and oriented to person, place, and time.      Assessment & Plan:  Strain of left shoulder, initial encounter -     Betamethasone Sod Phos & Acet -     Ambulatory referral to Physical Therapy  Pinched cervical nerve root  Other orders -     predniSONE; Take 5 daily for 3 days followed by 4,3,2 and 1 for 3 days each.  Dispense: 45 tablet; Refill: 0     Follow-up: Return if symptoms worsen or fail to improve.  Roise Cleaver, M.D.

## 2024-04-26 NOTE — Telephone Encounter (Signed)
 Patient has appt with [facility]

## 2024-04-26 NOTE — Telephone Encounter (Signed)
  Chief Complaint: left shoulder/neck pain Symptoms: upper back/neck/left shoulder/left arm pain, numbness and weakness in left hand (intermittent) Frequency: x about 1 month, worsening past few days Pertinent Negatives: Patient denies loss of bowel or bladder control, fever, abdominal pain, blood in urine, chest pain Disposition: [] ED /[] Urgent Care (no appt availability in office) / [x] Appointment(In office/virtual)/ []  [facility]/ [] Home Care/ [] Refused Recommended Disposition /[] [facility]/ []  Follow-up with PCP Additional Notes: Patient called in for triage for symptoms on 04/21/24 and went to urgent care that day (due to he was going out of town). He states he was given a steroid shot, steroid pills, muscle relaxer and ibuprofen. He states it improved slightly but now feels the same as before. Patient agreeable to see DOD today.  Copied from CRM [id number]. Topic: Clinical - Red Word Triage >> Apr 26, 2024  8:25 AM Eleanore Grey wrote: Red Word that prompted transfer to Nurse Triage: Shooting pain in neck and left arm, going numb. Was triaged for it last week and went to urgent care since no appointments were available. Still having the same issue. Reason for Disposition  Weakness (i.e., loss of strength) in hand or fingers  (Exception: Not truly weak; hand feels weak because of pain.)  Answer Assessment - Initial Assessment Questions 1. ONSET: "When did the pain start?"     X few weeks.  2. LOCATION: "Where is the pain located?"     Left.  3. PAIN: "How bad is the pain?" (Scale 1-10; or mild, moderate, severe)   - MILD (1-3): doesn't interfere with normal activities   - MODERATE (4-7): interferes with normal activities (e.g., work or school) or awakens from sleep   - SEVERE (8-10): excruciating pain, unable to do any normal activities, unable to move arm at all due to pain     6/10.  4. WORK OR EXERCISE: "Has there been any recent work or exercise that involved this  part of the body?"     No.  5. CAUSE: "What do you think is causing the shoulder pain?"     Diagnosed with Cervical radiculopathy at urgent care on 04/21/24.  6. OTHER SYMPTOMS: "Do you have any other symptoms?" (e.g., neck pain, swelling, rash, fever, numbness, weakness)     Numbness and weakness (comes and goes) in left hand, swelling along neck/left shoulder "feels like a knot".  7. PREGNANCY: "Is there any chance you are pregnant?" "When was your last menstrual period?"     N/A.  Protocols used: Shoulder Pain-A-AH

## 2024-04-27 ENCOUNTER — Telehealth: Payer: Self-pay

## 2024-04-27 NOTE — Telephone Encounter (Signed)
 NA/NVM prednisone was sent to [facility] after 5 pm yesterday

## 2024-04-27 NOTE — Telephone Encounter (Signed)
 Copied from CRM [id number]. Topic: Clinical - Prescription Issue >> Apr 26, 2024  5:07 PM Phil Braun wrote: Reason for CRM: Pt is at pharmacy getting medication that Dr Veleta Gerold was to send over today from his appt today. There is nothing there and I dont see where anything was sent. Please advise, pt of status.

## 2024-04-29 ENCOUNTER — Telehealth: Payer: Self-pay

## 2024-04-29 NOTE — Telephone Encounter (Signed)
 There is no mention of a muscle relaxer in office note. Looks like IBU was sent to pharmacy but only for a short period. Do you want this to wait for [facility]?

## 2024-04-29 NOTE — Telephone Encounter (Signed)
 Copied from CRM [id number]. Topic: Clinical - Prescription Issue >> Apr 29, 2024  1:07 PM Lotus Round B wrote: Reason for CRM: pt called in to see the status if the muscle relaxer but the pt didn't know the name of it he said dr Veleta Gerold was going send it with the prednisone but the pharmacy never gave it to him and he also wants to know about the Ibuprofen 800 as well. If there is any way someone can give the pt a call on this matter

## 2024-04-30 NOTE — Telephone Encounter (Signed)
 Please let the patient know that I sent their prescription to their pharmacy. Thanks, WS

## 2024-05-02 NOTE — Telephone Encounter (Signed)
 Pt made aware. LS

## 2024-05-17 NOTE — Progress Notes (Unsigned)
   Complete physical exam  Patient: LAQUAN BEIER   DOB: 1981/01/28   43 y.o. Male  MRN: [id number]  Subjective:    No chief complaint on file.   MASOUD NYCE is a 43 y.o. male who presents today for a complete physical exam. He reports consuming a {diet types:17450} diet. {types:19826} He generally feels {DESC; WELL/FAIRLY WELL/POORLY:18703}. He reports sleeping {DESC; WELL/FAIRLY WELL/POORLY:18703}. He {does/does not:200015} have additional problems to discuss today.    Most recent fall risk assessment:    03/05/2024   12:38 AM  Fall Risk   Falls in the past year? 0  Number falls in past yr: 0  Injury with Fall? 0  Risk for fall due to : No Fall Risks  Follow up Falls evaluation completed     Most recent depression screenings:    04/26/2024   11:33 AM 03/03/2024    9:58 AM  PHQ 2/9 Scores  PHQ - 2 Score 0 0  PHQ- 9 Score 2 3    {VISON DENTAL STD PSA (Optional):27386}  {History (Optional):23778}  Patient Care Team: Deitra Morton Hummer, Nena, NP as PCP - General (Nurse Practitioner)   Outpatient Medications Prior to Visit  Medication Sig   albuterol  (VENTOLIN  HFA) 108 (90 Base) MCG/ACT inhaler Inhale 2 puffs into the lungs every 6 (six) hours as needed for wheezing or shortness of breath.   azelastine  (ASTELIN ) 0.1 % nasal spray Place 1 spray into both nostrils 2 (two) times daily. Use in each nostril as directed   buPROPion  (WELLBUTRIN  SR) 150 MG 12 hr tablet Take 1 tablet (150 mg total) by mouth 2 (two) times daily.   levocetirizine (XYZAL ) 5 MG tablet Take 1 tablet (5 mg total) by mouth every evening.   mupirocin  ointment (BACTROBAN ) 2 % Apply 1 Application topically 2 (two) times daily.   ondansetron  (ZOFRAN ) 4 MG tablet Take 1 tablet (4 mg total) by mouth every 8 (eight) hours as needed for nausea or vomiting.   predniSONE  (DELTASONE ) 10 MG tablet Take 5 daily for 3 days followed by 4,3,2 and 1 for 3 days each.   Facility-Administered Medications Prior to Visit   Medication Dose Route Frequency Provider   betamethasone  acetate-betamethasone  sodium phosphate (CELESTONE ) injection 6 mg  6 mg Intramuscular Once     ROS Negative unless indicated in HPI    Objective:     There were no vitals taken for this visit. {Vitals History (Optional):23777}  Physical Exam   No results found for any visits on 05/19/24. {Show previous labs (optional):23779}    Assessment & Plan:    Routine Health Maintenance and Physical Exam  Discussed health benefits of physical activity, and encouraged him to engage in regular exercise appropriate for his age and condition.  There are no diagnoses linked to this encounter.  No follow-ups on file.     @Emmet Messer  Hummer, [place]

## 2024-05-19 ENCOUNTER — Other Ambulatory Visit: Payer: Self-pay | Admitting: Nurse Practitioner

## 2024-05-19 ENCOUNTER — Ambulatory Visit: Payer: Managed Care, Other (non HMO) | Admitting: Nurse Practitioner

## 2024-05-19 ENCOUNTER — Encounter: Payer: Self-pay | Admitting: Nurse Practitioner

## 2024-05-19 VITALS — BP 128/71 | HR 61 | Temp 97.2°F | Ht 76.0 in | Wt 246.2 lb

## 2024-05-19 DIAGNOSIS — G542 Cervical root disorders, not elsewhere classified: Secondary | ICD-10-CM

## 2024-05-19 DIAGNOSIS — Z716 Tobacco abuse counseling: Secondary | ICD-10-CM | POA: Diagnosis not present

## 2024-05-19 DIAGNOSIS — J302 Other seasonal allergic rhinitis: Secondary | ICD-10-CM

## 2024-05-19 DIAGNOSIS — Z0001 Encounter for general adult medical examination with abnormal findings: Secondary | ICD-10-CM | POA: Diagnosis not present

## 2024-05-19 DIAGNOSIS — Z0000 Encounter for general adult medical examination without abnormal findings: Secondary | ICD-10-CM | POA: Insufficient documentation

## 2024-05-19 DIAGNOSIS — E7800 Pure hypercholesterolemia, unspecified: Secondary | ICD-10-CM

## 2024-05-19 DIAGNOSIS — L740 Miliaria rubra: Secondary | ICD-10-CM

## 2024-05-19 DIAGNOSIS — Z1211 Encounter for screening for malignant neoplasm of colon: Secondary | ICD-10-CM | POA: Insufficient documentation

## 2024-05-19 LAB — CMP14+EGFR

## 2024-05-19 LAB — HEPB+HEPC+HIV PANEL

## 2024-05-19 LAB — LIPID PANEL

## 2024-05-19 LAB — TSH

## 2024-05-19 MED ORDER — BUPROPION HCL ER (SR) 150 MG PO TB12: 150.0000 mg | ORAL_TABLET | Freq: Two times a day (BID) | ORAL | 1 refills | Status: AC

## 2024-05-19 MED ORDER — FEXOFENADINE HCL 180 MG PO TABS
180.0000 mg | ORAL_TABLET | Freq: Every day | ORAL | 1 refills | Status: DC
Start: 2024-05-19 — End: 2024-09-22

## 2024-05-19 MED ORDER — HYDROCORTISONE 1 % EX CREA
1.0000 | TOPICAL_CREAM | Freq: Two times a day (BID) | CUTANEOUS | 1 refills | Status: DC
Start: 2024-05-19 — End: 2024-07-18

## 2024-05-19 MED ORDER — TIZANIDINE HCL 4 MG PO CAPS: 4.0000 mg | ORAL_CAPSULE | Freq: Three times a day (TID) | ORAL | 1 refills | Status: DC | PRN

## 2024-05-20 LAB — HEPB+HEPC+HIV PANEL
Hep B Surface Ab, Qual: NONREACTIVE
Hep C Virus Ab: NONREACTIVE

## 2024-05-20 LAB — CBC WITH DIFFERENTIAL/PLATELET
Basophils Absolute: 0 10*3/uL (ref 0.0–0.2)
Basos: 1 %
EOS (ABSOLUTE): 0.1 10*3/uL (ref 0.0–0.4)
Eos: 1 %
Hematocrit: 42.5 % (ref 37.5–51.0)
Hemoglobin: 13.6 g/dL (ref 13.0–17.7)
Immature Grans (Abs): 0 10*3/uL (ref 0.0–0.1)
Immature Granulocytes: 0 %
Lymphocytes Absolute: 1.6 10*3/uL (ref 0.7–3.1)
Lymphs: 27 %
MCH: 27.5 pg (ref 26.6–33.0)
MCHC: 32 g/dL (ref 31.5–35.7)
MCV: 86 fL (ref 79–97)
Monocytes Absolute: 0.4 10*3/uL (ref 0.1–0.9)
Monocytes: 7 %
Neutrophils Absolute: 3.8 10*3/uL (ref 1.4–7.0)
Neutrophils: 64 %
Platelets: 156 10*3/uL (ref 150–450)
RBC: 4.95 x10E6/uL (ref 4.14–5.80)
RDW: 13.1 % (ref 11.6–15.4)
WBC: 5.9 10*3/uL (ref 3.4–10.8)

## 2024-05-20 LAB — CMP14+EGFR
AST: 23 IU/L (ref 0–40)
Alkaline Phosphatase: 53 IU/L (ref 44–121)
BUN: 15 mg/dL (ref 6–24)
Bilirubin Total: 0.3 mg/dL (ref 0.0–1.2)
CO2: 22 mmol/L (ref 20–29)
Calcium: 9.9 mg/dL (ref 8.7–10.2)
Chloride: 104 mmol/L (ref 96–106)
Creatinine, Ser: 0.94 mg/dL (ref 0.76–1.27)
Globulin, Total: 2 g/dL (ref 1.5–4.5)
Sodium: 141 mmol/L (ref 134–144)

## 2024-05-20 LAB — LIPID PANEL
Cholesterol, Total: 226 mg/dL — AB (ref 100–199)
HDL: 45 mg/dL (ref >39)
LDL CALC COMMENT:: 5 ratio (ref 0.0–5.0)
VLDL Cholesterol Cal: 24 mg/dL (ref 5–40)

## 2024-05-23 ENCOUNTER — Other Ambulatory Visit (HOSPITAL_COMMUNITY): Payer: Self-pay

## 2024-05-23 ENCOUNTER — Telehealth: Payer: Self-pay

## 2024-05-23 ENCOUNTER — Ambulatory Visit: Payer: Self-pay | Admitting: Nurse Practitioner

## 2024-05-23 ENCOUNTER — Other Ambulatory Visit: Payer: Self-pay | Admitting: Nurse Practitioner

## 2024-05-23 DIAGNOSIS — R7989 Other specified abnormal findings of blood chemistry: Secondary | ICD-10-CM | POA: Insufficient documentation

## 2024-05-23 NOTE — Telephone Encounter (Signed)
 Pharmacy Patient Advocate Encounter   Received notification from Onbase that prior authorization for tiZANidine  HCl 4MG  capsules  is required/requested.   Insurance verification completed.   The patient is insured through [employer] .   Per test claim:  Tizanidine  tablets is preferred by the insurance.  If suggested medication is appropriate, Please send in a new RX and discontinue this one. If not, please advise as to why it's not appropriate so that we may request a Prior Authorization. Please note, some preferred medications may still require a PA.  If the suggested medications have not been trialed and there are no contraindications to their use, the PA will not be submitted, as it will not be approved.

## 2024-06-13 ENCOUNTER — Telehealth (INDEPENDENT_AMBULATORY_CARE_PROVIDER_SITE_OTHER): Admitting: Family Medicine

## 2024-06-13 ENCOUNTER — Encounter: Payer: Self-pay | Admitting: Family Medicine

## 2024-06-13 ENCOUNTER — Telehealth: Payer: Self-pay | Admitting: Family Medicine

## 2024-06-13 DIAGNOSIS — A084 Viral intestinal infection, unspecified: Secondary | ICD-10-CM

## 2024-06-13 NOTE — Progress Notes (Signed)
 MyChart Video visit  Subjective: RR:yzjijryz PCP: [name], NP YEP:Gzmzfb James Sosa is a 43 y.o. male. Patient provides verbal consent for consult held via video.  Due to COVID-19 pandemic this visit was conducted virtually. This visit type was conducted due to national recommendations for restrictions regarding the COVID-19 Pandemic (e.g. social distancing, sheltering in place) in an effort to limit this patient's exposure and mitigate transmission in our community. All issues noted in this document were discussed and addressed.  A physical exam was not performed with this format.   Location of patient: home Location of provider: [facility] Others present for call: none  1. Headache Patient reports he had a severe headache and diarrhea w/ nausea that occurred this morning.  No fever, no blood in stool.  He took some zofran  and that helped resolve the nausea.  Tylenol resolved headache by lunch time.  Diarrhea still occurring but less frequency.  Able to hydrate.  Overall feeling better but will be needing a work note   ROS: Per HPI  No Known Allergies Past Medical History:  Diagnosis Date   Allergy    Asthma    Elevated LDL cholesterol level 12/24/2020   Vasovagal syncope     Current Outpatient Medications:    albuterol  (VENTOLIN  HFA) 108 (90 Base) MCG/ACT inhaler, Inhale 2 puffs into the lungs every 6 (six) hours as needed for wheezing or shortness of breath., Disp: 18 g, Rfl: 1   azelastine  (ASTELIN ) 0.1 % nasal spray, Place 1 spray into both nostrils 2 (two) times daily. Use in each nostril as directed, Disp: 30 mL, Rfl: 1   buPROPion  (WELLBUTRIN  SR) 150 MG 12 hr tablet, Take 1 tablet (150 mg total) by mouth 2 (two) times daily., Disp: 180 tablet, Rfl: 1   fexofenadine  (ALLEGRA  ALLERGY) 180 MG tablet, Take 1 tablet (180 mg total) by mouth daily., Disp: 90 tablet, Rfl: 1   hydrocortisone  cream 1 %, Apply 1 Application topically 2 (two) times daily., Disp: 56 g, Rfl: 1    ondansetron  (ZOFRAN ) 4 MG tablet, Take 1 tablet (4 mg total) by mouth every 8 (eight) hours as needed for nausea or vomiting., Disp: 30 tablet, Rfl: 0   tiZANidine  (ZANAFLEX ) 4 MG capsule, TAKE 1 CAPSULE BY MOUTH THREE TIMES DAILY AS NEEDED FOR MUSCLE SPASM, Disp: 60 capsule, Rfl: 0  Gen: Nontoxic male HEENT: Sclera white.  Moist Dukas membranes Neuro: Oriented  Assessment/ Plan: 43 y.o. male   Viral gastroenteritis  Self-limited and improving with home medications.  Work note provided.  Reinforced hydration.  Follow-up as needed   Start time: 4:28pm End time: 4:32pm  Total time spent on patient care (including video visit/ documentation): 5 minutes  Ngan Qualls CHRISTELLA Fielding, DO [facility] [phone]

## 2024-06-13 NOTE — Telephone Encounter (Signed)
 Scheduled appt.

## 2024-06-13 NOTE — Telephone Encounter (Signed)
 Patient needs an appointment for a work note/   Copied from [employer] [id number]. Topic: Clinical - Medical Advice >> Jun 13, 2024  3:31 PM Myrick T wrote: Reason for CRM: patient called stated he woke up with a headache and nausea. He wanted a virtual appt today but there were none. Stated he just needs a work note as he took something for his symptoms already

## 2024-07-18 ENCOUNTER — Telehealth: Payer: Self-pay

## 2024-07-18 NOTE — Telephone Encounter (Signed)
 Who is your primary care physician: Nena Sammy Hummer  Reasons for the colonoscopy: screening   Have you had a colonoscopy before?  no  Do you have family history of colon cancer? Yes Grandmother  Previous colonoscopy with polyps removed? no  Do you have a history colorectal cancer?   no  Are you diabetic? If yes, Type 1 or Type 2?    no  Do you have a prosthetic or mechanical heart valve? no  Do you have a pacemaker/defibrillator?   no  Have you had endocarditis/atrial fibrillation? no  Have you had joint replacement within the last 12 months?  no  Do you tend to be constipated or have to use laxatives? no  Do you have any history of drugs or alchohol?  no  Do you use supplemental oxygen?  no  Have you had a stroke or heart attack within the last 6 months? no  Do you take weight loss medication?  no   Do you take any blood-thinning medications such as: (aspirin, warfarin, Plavix, Aggrenox)  no  If yes we need the name, milligram, dosage and who is prescribing doctor  Current Outpatient Medications on File Prior to Visit  Medication Sig Dispense Refill   buPROPion  (WELLBUTRIN  SR) 150 MG 12 hr tablet Take 1 tablet (150 mg total) by mouth 2 (two) times daily. 180 tablet 1   fexofenadine  (ALLEGRA  ALLERGY) 180 MG tablet Take 1 tablet (180 mg total) by mouth daily. 90 tablet 1   No current facility-administered medications on file prior to visit.    No Known Allergies   Pharmacy: [facility]  Primary Insurance Name: Sherleen [id number]  Best number where you can be reached: [phone]

## 2024-07-19 ENCOUNTER — Other Ambulatory Visit: Payer: Self-pay | Admitting: *Deleted

## 2024-07-19 ENCOUNTER — Encounter: Payer: Self-pay | Admitting: *Deleted

## 2024-07-19 MED ORDER — PEG 3350-KCL-NA BICARB-NACL 420 G PO SOLR
4000.0000 mL | Freq: Once | ORAL | 0 refills | Status: AC
Start: 2024-07-19 — End: 2024-07-19

## 2024-07-19 NOTE — Telephone Encounter (Signed)
 Pt has been scheduled for 07/28/24. Instructions sent via mychart and prep sent to pharmacy.

## 2024-07-19 NOTE — Telephone Encounter (Signed)
 Any room Thanks

## 2024-07-20 ENCOUNTER — Encounter: Payer: Self-pay | Admitting: *Deleted

## 2024-07-20 NOTE — Telephone Encounter (Signed)
 Pt has been rescheduled to Friday 08/05/24. Updated instructions sent via mychart.

## 2024-07-20 NOTE — Telephone Encounter (Signed)
 LMOVM to return call   Pt left vm stating he needs to reschedule his procedure because his wife is unable to get the date of 07/28/24 off from work.

## 2024-07-26 ENCOUNTER — Encounter (HOSPITAL_COMMUNITY): Payer: Self-pay

## 2024-07-26 NOTE — Telephone Encounter (Signed)
 Referral was closed in 03/2024

## 2024-08-02 ENCOUNTER — Encounter: Payer: Self-pay | Admitting: Family

## 2024-08-02 ENCOUNTER — Telehealth (INDEPENDENT_AMBULATORY_CARE_PROVIDER_SITE_OTHER): Admitting: Family

## 2024-08-02 DIAGNOSIS — J069 Acute upper respiratory infection, unspecified: Secondary | ICD-10-CM | POA: Diagnosis not present

## 2024-08-02 MED ORDER — FLUTICASONE PROPIONATE 50 MCG/ACT NA SUSP: 2.0000 | Freq: Every day | NASAL | 6 refills | Status: AC

## 2024-08-02 NOTE — Progress Notes (Signed)
 Virtual Visit Consent   James Sosa, you are scheduled for a virtual visit with a [facility] provider today. Just as with appointments in the office, your consent must be obtained to participate. Your consent will be active for this visit and any virtual visit you may have with one of our providers in the next 365 days. If you have a MyChart account, a copy of this consent can be sent to you electronically.  As this is a virtual visit, video technology does not allow for your provider to perform a traditional examination. This may limit your provider's ability to fully assess your condition. If your provider identifies any concerns that need to be evaluated in person or the need to arrange testing (such as labs, EKG, etc.), we will make arrangements to do so. Although advances in technology are sophisticated, we cannot ensure that it will always work on either your end or our end. If the connection with a video visit is poor, the visit may have to be switched to a telephone visit. With either a video or telephone visit, we are not always able to ensure that we have a secure connection.  By engaging in this virtual visit, you consent to the provision of healthcare and authorize for your insurance to be billed (if applicable) for the services provided during this visit. Depending on your insurance coverage, you may receive a charge related to this service.  I need to obtain your verbal consent now. Are you willing to proceed with your visit today? James Sosa has provided verbal consent on 08/02/2024 for a virtual visit (video or telephone). Bari Learn, FNP  Date: 08/02/2024 4:20 PM   Virtual Visit via Video Note   I, Bari Learn, connected with  James Sosa  ([id number], 24-Jul-1981) on 08/02/24 at  4:30 PM EDT by a video-enabled telemedicine application and verified that I am speaking with the correct person using two identifiers.  Location: Patient: Virtual Visit Location Patient:  Home Provider: Virtual Visit Location Provider: Home Office   I discussed the limitations of evaluation and management by telemedicine and the availability of in person appointments. The patient expressed understanding and agreed to proceed.    History of Present Illness: James Sosa is a 43 y.o. who identifies as a male who was assigned male at birth, and is being seen today for cough, congestion and fever that started today.  HPI: URI  This is a new problem. The current episode started today. The problem has been unchanged. Maximum temperature: 99.4 F. Associated symptoms include congestion, coughing, headaches, a sore throat and swollen glands. Pertinent negatives include no ear pain, joint pain, rhinorrhea or sinus pain. He has tried decongestant for the symptoms. The treatment provided mild relief.    Problems:  Patient Active Problem List   Diagnosis Date Noted   Abnormal serum thyroid  stimulating hormone (TSH) level 05/23/2024   Annual physical exam 05/19/2024   Pinched cervical nerve root 05/19/2024   Heat rash 05/19/2024   Allergic rhinitis 03/09/2024   Acute viral sinusitis 03/09/2024   Family hx of colon cancer 03/03/2024   Screening for colon cancer 03/03/2024   Family history of breast cancer in mother 03/03/2024   Encounter for smoking cessation counseling 12/03/2023   Screening PSA (prostate specific antigen) 05/18/2023   Routine medical exam 05/18/2023   Encounter to establish care 05/18/2023   Elevated LDL cholesterol level 12/24/2020   Seasonal allergies 08/04/2019   Asthma     Allergies:  No Known Allergies Medications:  Current Outpatient Medications:    fluticasone  (FLONASE ) 50 MCG/ACT nasal spray, Place 2 sprays into both nostrils daily., Disp: 16 g, Rfl: 6   buPROPion  (WELLBUTRIN  SR) 150 MG 12 hr tablet, Take 1 tablet (150 mg total) by mouth 2 (two) times daily., Disp: 180 tablet, Rfl: 1   fexofenadine  (ALLEGRA  ALLERGY) 180 MG tablet, Take 1 tablet (180 mg  total) by mouth daily., Disp: 90 tablet, Rfl: 1  Observations/Objective: Patient is well-developed, well-nourished in no acute distress.  Resting comfortably at home.  Head is normocephalic, atraumatic.  No labored breathing.  Speech is clear and coherent with logical content.  Patient is alert and oriented at baseline.    Assessment and Plan: 1. Viral URI (Primary) - fluticasone  (FLONASE ) 50 MCG/ACT nasal spray; Place 2 sprays into both nostrils daily.  Dispense: 16 g; Refill: 6  - Take meds as prescribed - Use a cool mist humidifier  -Use saline nose sprays frequently -Force fluids -For any cough or congestion  Use plain Mucinex- regular strength or max strength is fine -For fever or aces or pains- take tylenol or ibuprofen. -Throat lozenges if help Work note given  -Follow up if symptoms worsen or do not improve   Follow Up Instructions: I discussed the assessment and treatment plan with the patient. The patient was provided an opportunity to ask questions and all were answered. The patient agreed with the plan and demonstrated an understanding of the instructions.  A copy of instructions were sent to the patient via MyChart unless otherwise noted below.     The patient was advised to call back or seek an in-person evaluation if the symptoms worsen or if the condition fails to improve as anticipated.    Bari Learn, FNP

## 2024-08-02 NOTE — Patient Instructions (Signed)

## 2024-08-05 ENCOUNTER — Other Ambulatory Visit: Payer: Self-pay

## 2024-08-05 ENCOUNTER — Ambulatory Visit (HOSPITAL_COMMUNITY)
Admission: RE | Admit: 2024-08-05 | Discharge: 2024-08-05 | Disposition: A | Discharge status: Home / Self Care | Payer: Self-pay | Attending: Gastroenterology | Admitting: Gastroenterology

## 2024-08-05 ENCOUNTER — Encounter (HOSPITAL_COMMUNITY): Payer: Self-pay | Admitting: Gastroenterology

## 2024-08-05 ENCOUNTER — Ambulatory Visit (HOSPITAL_COMMUNITY): Admitting: Anesthesiology

## 2024-08-05 ENCOUNTER — Encounter (HOSPITAL_COMMUNITY)
Admission: RE | Disposition: A | Discharge status: Home / Self Care | Payer: Self-pay | Source: Home / Self Care | Attending: Gastroenterology

## 2024-08-05 DIAGNOSIS — Z800 Family history of malignant neoplasm of digestive organs: Secondary | ICD-10-CM | POA: Diagnosis not present

## 2024-08-05 DIAGNOSIS — K5730 Diverticulosis of large intestine without perforation or abscess without bleeding: Secondary | ICD-10-CM | POA: Diagnosis not present

## 2024-08-05 DIAGNOSIS — Z1211 Encounter for screening for malignant neoplasm of colon: Secondary | ICD-10-CM | POA: Diagnosis present

## 2024-08-05 DIAGNOSIS — F17290 Nicotine dependence, other tobacco product, uncomplicated: Secondary | ICD-10-CM | POA: Insufficient documentation

## 2024-08-05 DIAGNOSIS — K648 Other hemorrhoids: Secondary | ICD-10-CM | POA: Insufficient documentation

## 2024-08-05 HISTORY — PX: COLONOSCOPY: SHX5424

## 2024-08-05 LAB — HM COLONOSCOPY

## 2024-08-05 SURGERY — COLONOSCOPY
Anesthesia: General

## 2024-08-05 MED ORDER — LACTATED RINGERS IV SOLN: INTRAVENOUS | Status: DC | PRN

## 2024-08-05 MED ORDER — PROPOFOL 500 MG/50ML IV EMUL
INTRAVENOUS | Status: DC | PRN
  Administered 2024-08-05: 100 mg via INTRAVENOUS
  Administered 2024-08-05: 150 ug/kg/min via INTRAVENOUS

## 2024-08-05 NOTE — Discharge Instructions (Signed)
 You are being discharged to home.  Resume your previous diet.  Your physician has recommended a repeat colonoscopy in 10 years for screening purposes.

## 2024-08-05 NOTE — Anesthesia Preprocedure Evaluation (Addendum)
 Anesthesia Evaluation  Patient identified by MRN, date of birth, ID band Patient awake    Reviewed: Allergy & Precautions, H&P , NPO status , Patient's Chart, lab work & pertinent test results  Airway Mallampati: I  TM Distance: >3 FB Neck ROM: Full    Dental no notable dental hx.    Pulmonary asthma    Pulmonary exam normal breath sounds clear to auscultation       Cardiovascular negative cardio ROS Normal cardiovascular exam Rhythm:Regular Rate:Normal     Neuro/Psych  Neuromuscular disease  negative psych ROS   GI/Hepatic negative GI ROS, Neg liver ROS,,,  Endo/Other  negative endocrine ROS    Renal/GU negative Renal ROS  negative genitourinary   Musculoskeletal negative musculoskeletal ROS (+)    Abdominal   Peds negative pediatric ROS (+)  Hematology negative hematology ROS (+)   Anesthesia Other Findings   Reproductive/Obstetrics negative OB ROS                              Anesthesia Physical Anesthesia Plan  ASA: 1  Anesthesia Plan: General   Post-op Pain Management:    Induction: Intravenous  PONV Risk Score and Plan:   Airway Management Planned: Nasal Cannula  Additional Equipment:   Intra-op Plan:   Post-operative Plan:   Informed Consent: I have reviewed the patients History and Physical, chart, labs and discussed the procedure including the risks, benefits and alternatives for the proposed anesthesia with the patient or authorized representative who has indicated his/her understanding and acceptance.     Dental advisory given  Plan Discussed with: CRNA  Anesthesia Plan Comments:          Anesthesia Quick Evaluation

## 2024-08-05 NOTE — H&P (Addendum)
 James Sosa is an 43 y.o. male.   Chief Complaint: screening colonoscopy HPI: 43 y/o male with past medical history of asthma, coming for screening colonoscopy. The patient has never had a colonoscopy in the past.  The patient denies having any complaints such as melena, hematochezia, abdominal pain or distention, change in her bowel movement consistency or frequency, no changes in weight recently.  Patient reported his grandmother was diagnosed with colon cancer in her mid 47s.   Past Medical History:  Diagnosis Date   Allergy    Asthma    Elevated LDL cholesterol level 12/24/2020   Vasovagal syncope     Past Surgical History:  Procedure Laterality Date   ANKLE SURGERY Left     Family History  Problem Relation Age of Onset   Thyroid  disease Mother    Diverticulitis Mother    Heart disease Mother    Heart attack Mother    Diabetes Father    Colon cancer Maternal Grandmother        early 20s   Asthma Son    Asthma Son    Social History:  reports that he has been smoking e-cigarettes. He has never used smokeless tobacco. He reports current alcohol use. He reports that he does not use drugs.  Allergies: No Known Allergies  Medications Prior to Admission  Medication Sig Dispense Refill   buPROPion  (WELLBUTRIN  SR) 150 MG 12 hr tablet Take 1 tablet (150 mg total) by mouth 2 (two) times daily. 180 tablet 1   fexofenadine  (ALLEGRA  ALLERGY) 180 MG tablet Take 1 tablet (180 mg total) by mouth daily. 90 tablet 1   fluticasone  (FLONASE ) 50 MCG/ACT nasal spray Place 2 sprays into both nostrils daily. 16 g 6    No results found for this or any previous visit (from the past 48 hours). No results found.  Review of Systems  All other systems reviewed and are negative.   Blood pressure 131/81, pulse 67, temperature (!) 97.5 F (36.4 C), temperature source Oral, resp. rate 16, height 6' 4 (1.93 m), weight 104.3 kg, SpO2 99%. Physical Exam  GENERAL: The patient is AO x3, in no  acute distress. HEENT: Head is normocephalic and atraumatic. EOMI are intact. Mouth is well hydrated and without lesions. NECK: Supple. No masses LUNGS: Clear to auscultation. No presence of rhonchi/wheezing/rales. Adequate chest expansion HEART: RRR, normal s1 and s2. ABDOMEN: Soft, nontender, no guarding, no peritoneal signs, and nondistended. BS +. No masses. EXTREMITIES: Without any cyanosis, clubbing, rash, lesions or edema. NEUROLOGIC: AOx3, no focal motor deficit. SKIN: no jaundice, no rashes  Assessment/Plan 43 y/o male with past medical history of asthma, coming for screening colonoscopy. The patient is at average risk for colorectal cancer.  We will proceed with colonoscopy today.   Toribio Eartha Flavors, MD 08/05/2024, 9:26 AM

## 2024-08-05 NOTE — Transfer of Care (Signed)
 Immediate Anesthesia Transfer of Care Note  Patient: James Sosa  Procedure(s) Performed: COLONOSCOPY  Patient Location: Endoscopy Unit  Anesthesia Type:General  Level of Consciousness: drowsy  Airway & Oxygen Therapy: Patient Spontanous Breathing  Post-op Assessment: Report given to RN and Post -op Vital signs reviewed and stable  Post vital signs: Reviewed and stable  Last Vitals:  Vitals Value Taken Time  BP 111/71 08/05/24 10:18  Temp 36.6 C 08/05/24 10:18  Pulse 85 08/05/24 10:18  Resp 18 08/05/24 10:18  SpO2 96 % 08/05/24 10:18    Last Pain:  Vitals:   08/05/24 1018  TempSrc: Oral  PainSc:       Patients Stated Pain Goal: 6 (08/05/24 0905)  Complications: No notable events documented.

## 2024-08-05 NOTE — Anesthesia Postprocedure Evaluation (Signed)
 Anesthesia Post Note  Patient: James Sosa  Procedure(s) Performed: COLONOSCOPY  Patient location during evaluation: PACU Anesthesia Type: General Level of consciousness: awake and alert Pain management: pain level controlled Vital Signs Assessment: post-procedure vital signs reviewed and stable Respiratory status: spontaneous breathing, nonlabored ventilation, respiratory function stable and patient connected to nasal cannula oxygen Cardiovascular status: blood pressure returned to baseline and stable Postop Assessment: no apparent nausea or vomiting Anesthetic complications: no   No notable events documented.   Last Vitals:  Vitals:   08/05/24 0905 08/05/24 1018  BP: 131/81 111/71  Pulse: 67 85  Resp: 16 18  Temp: (!) 36.4 C 36.6 C  SpO2: 99% 96%    Last Pain:  Vitals:   08/05/24 1021  TempSrc:   PainSc: 0-No pain                 Andrea Limes

## 2024-08-05 NOTE — Op Note (Signed)
 [facility] Patient Name: James Sosa Procedure Date: 08/05/2024 9:52 AM MRN: [id number] Date of Birth: September 07, 1981 Attending MD: Toribio Fortune , , [id number] CSN: [id number] Age: 43 Admit Type: Outpatient Procedure:                Colonoscopy Indications:              Screening for colorectal malignant neoplasm Providers:                Toribio Fortune, Jon LABOR. Gerome RN, RN, Jon Loge Referring MD:              Medicines:                Monitored Anesthesia Care Complications:            No immediate complications. Estimated Blood Loss:     Estimated blood loss: none. Procedure:                Pre-Anesthesia Assessment:                           - Prior to the procedure, a History and Physical                            was performed, and patient medications, allergies                            and sensitivities were reviewed. The patient's                            tolerance of previous anesthesia was reviewed.                           - The risks and benefits of the procedure and the                            sedation options and risks were discussed with the                            patient. All questions were answered and informed                            consent was obtained.                           - ASA Grade Assessment: I - A normal, healthy                            patient.                           After obtaining informed consent, the colonoscope                            was passed under direct vision. Throughout the  procedure, the patient's blood pressure, pulse, and                            oxygen saturations were monitored continuously. The                            PCF-HQ190L ([id number]) Peds Colon was introduced                            through the anus and advanced to the the cecum,                            identified by appendiceal orifice and ileocecal                             valve. The colonoscopy was performed without                            difficulty. The patient tolerated the procedure                            well. The quality of the bowel preparation was                            excellent. Scope In: 10:01:16 AM Scope Out: 10:14:22 AM Scope Withdrawal Time: 0 hours 11 minutes 25 seconds  Total Procedure Duration: 0 hours 13 minutes 6 seconds  Findings:      The perianal and digital rectal examinations were normal.      A few small-mouthed diverticula were found in the transverse colon and       ascending colon.      Non-bleeding internal hemorrhoids were found during retroflexion. The       hemorrhoids were small. Impression:               - Diverticulosis in the transverse colon and in the                            ascending colon.                           - Non-bleeding internal hemorrhoids.                           - No specimens collected. Moderate Sedation:      Per Anesthesia Care Recommendation:           - Discharge patient to home (ambulatory).                           - Resume previous diet.                           - Repeat colonoscopy in 10 years for screening                            purposes. Procedure Code(s):        ---  Professional ---                           H9878, Colorectal cancer screening; colonoscopy on                            individual not meeting criteria for high risk Diagnosis Code(s):        --- Professional ---                           Z12.11, Encounter for screening for malignant                            neoplasm of colon                           K64.8, Other hemorrhoids                           K57.30, Diverticulosis of large intestine without                            perforation or abscess without bleeding CPT copyright 2022 American Medical Association. All rights reserved. The codes documented in this report are preliminary and upon coder review may  be revised to meet current  compliance requirements. Toribio Fortune, MD Toribio Fortune,  08/05/2024 10:19:09 AM This report has been signed electronically. Number of Addenda: 0

## 2024-08-08 ENCOUNTER — Encounter (INDEPENDENT_AMBULATORY_CARE_PROVIDER_SITE_OTHER): Payer: Self-pay | Admitting: *Deleted

## 2024-08-08 ENCOUNTER — Encounter (HOSPITAL_COMMUNITY): Payer: Self-pay | Admitting: Gastroenterology

## 2024-08-12 ENCOUNTER — Other Ambulatory Visit

## 2024-08-12 DIAGNOSIS — R7989 Other specified abnormal findings of blood chemistry: Secondary | ICD-10-CM

## 2024-08-13 LAB — THYROID PANEL WITH TSH
Free Thyroxine Index: 1.4 (ref 1.2–4.9)
T3 Uptake Ratio: 24 % (ref 24–39)
T4, Total: 5.8 ug/dL (ref 4.5–12.0)
TSH: 0.398 u[IU]/mL — ABNORMAL LOW (ref 0.450–4.500)

## 2024-08-15 ENCOUNTER — Other Ambulatory Visit: Payer: Self-pay | Admitting: Nurse Practitioner

## 2024-08-15 ENCOUNTER — Ambulatory Visit: Payer: Self-pay | Admitting: Nurse Practitioner

## 2024-08-15 DIAGNOSIS — R7989 Other specified abnormal findings of blood chemistry: Secondary | ICD-10-CM

## 2024-08-15 NOTE — Progress Notes (Signed)
 TSH repeated twice still low I will order component of thyroid  function test is normal, will refer to endocrine to rule out possible hyperthyroidism immune disease

## 2024-09-07 ENCOUNTER — Encounter (INDEPENDENT_AMBULATORY_CARE_PROVIDER_SITE_OTHER): Payer: Self-pay | Admitting: Gastroenterology

## 2024-09-20 NOTE — Progress Notes (Signed)
 Subjective:  Patient ID: James Sosa, male    DOB: 12/25/1980, 43 y.o.   MRN: [id number]  Patient Care Team: [name], NP as PCP - General (Nurse Practitioner)   Chief Complaint:  No chief complaint on file.   HPI: James Sosa is a 43 y.o. male presenting on 09/22/2024 for No chief complaint on file.   Discussed the use of AI scribe software for clinical note transcription with the patient, who gave verbal consent to proceed.  History of Present Illness       Relevant past medical, surgical, family, and social history reviewed and updated as indicated.  Allergies and medications reviewed and updated. Data reviewed: Chart in Epic.   Past Medical History:  Diagnosis Date   Allergy    Asthma    Elevated LDL cholesterol level 12/24/2020   Vasovagal syncope     Past Surgical History:  Procedure Laterality Date   ANKLE SURGERY Left    COLONOSCOPY N/A 08/05/2024   Procedure: COLONOSCOPY;  Surgeon: Eartha Angelia Sieving, MD;  Location: [facility];  Service: Gastroenterology;  Laterality: N/A;  9:15 am, asa 1-2    Social History   Socioeconomic History   Marital status: Married    Spouse name: Not on file   Number of children: Not on file   Years of education: Not on file   Highest education level: Not on file  Occupational History   Not on file  Tobacco Use   Smoking status: Every Day    Types: E-cigarettes   Smokeless tobacco: Never  Vaping Use   Vaping status: Every Day  Substance and Sexual Activity   Alcohol use: Yes    Comment: occ   Drug use: Never   Sexual activity: Not on file  Other Topics Concern   Not on file  Social History Narrative   Not on file   Social Drivers of Health   Financial Resource Strain: Not on file  Food Insecurity: No Food Insecurity (05/19/2024)   Hunger Vital Sign    Worried About Running Out of Food in the Last Year: Never true    Ran Out of Food in the Last Year: Never true   Transportation Needs: No Transportation Needs (05/19/2024)   PRAPARE - Administrator, Civil Service (Medical): No    Lack of Transportation (Non-Medical): No  Physical Activity: Not on file  Stress: Not on file  Social Connections: Not on file  Intimate Partner Violence: Unknown (05/19/2024)   Humiliation, Afraid, Rape, and Kick questionnaire    Fear of Current or Ex-Partner: No    Emotionally Abused: No    Physically Abused: No    Sexually Abused: Not on file    Outpatient Encounter Medications as of 09/22/2024  Medication Sig   buPROPion  (WELLBUTRIN  SR) 150 MG 12 hr tablet Take 1 tablet (150 mg total) by mouth 2 (two) times daily.   fexofenadine  (ALLEGRA  ALLERGY) 180 MG tablet Take 1 tablet (180 mg total) by mouth daily.   fluticasone  (FLONASE ) 50 MCG/ACT nasal spray Place 2 sprays into both nostrils daily.   No facility-administered encounter medications on file as of 09/22/2024.    No Known Allergies  Pertinent ROS per HPI, otherwise unremarkable      Objective:  There were no vitals taken for this visit.   Wt Readings from Last 3 Encounters:  08/05/24 230 lb (104.3 kg)  05/19/24 246 lb 3.2 oz (111.7 kg)  04/26/24 255  lb (115.7 kg)    Physical Exam Physical Exam      Results for orders placed or performed in visit on 08/12/24  Thyroid  Panel With TSH   Collection Time: 08/12/24 11:10 AM  Result Value Ref Range   TSH 0.398 (L) 0.450 - 4.500 uIU/mL   T4, Total 5.8 4.5 - 12.0 ug/dL   T3 Uptake Ratio 24 24 - 39 %   Free Thyroxine Index 1.4 1.2 - 4.9       Pertinent labs & imaging results that were available during my care of the patient were reviewed by me and considered in my medical decision making.  Assessment & Plan:  There are no diagnoses linked to this encounter.   Assessment and Plan Assessment & Plan       Continue all other maintenance medications.  Follow up plan: No follow-ups on file.   Continue healthy lifestyle  choices, including diet (rich in fruits, vegetables, and lean proteins, and low in salt and simple carbohydrates) and exercise (at least 30 minutes of moderate physical activity daily).  Educational handout given for ***  The above assessment and management plan was discussed with the patient. The patient verbalized understanding of and has agreed to the management plan. Patient is aware to call the clinic if they develop any new symptoms or if symptoms persist or worsen. Patient is aware when to return to the clinic for a follow-up visit. Patient educated on when it is appropriate to go to the emergency department.   Senan Urey [place] Thompson, DNP [facility] [address] [place], [place] [address] [phone]

## 2024-09-22 ENCOUNTER — Ambulatory Visit: Admitting: Nurse Practitioner

## 2024-09-22 ENCOUNTER — Encounter: Payer: Self-pay | Admitting: Nurse Practitioner

## 2024-09-22 VITALS — BP 121/74 | HR 74 | Temp 97.8°F | Ht 76.0 in | Wt 233.0 lb

## 2024-09-22 DIAGNOSIS — R7989 Other specified abnormal findings of blood chemistry: Secondary | ICD-10-CM | POA: Diagnosis not present

## 2024-09-22 DIAGNOSIS — J302 Other seasonal allergic rhinitis: Secondary | ICD-10-CM

## 2024-09-22 DIAGNOSIS — J309 Allergic rhinitis, unspecified: Secondary | ICD-10-CM

## 2024-09-22 MED ORDER — FEXOFENADINE HCL 180 MG PO TABS: 180.0000 mg | ORAL_TABLET | Freq: Every day | ORAL | 1 refills | Status: AC

## 2024-09-29 ENCOUNTER — Ambulatory Visit (INDEPENDENT_AMBULATORY_CARE_PROVIDER_SITE_OTHER): Admitting: Nurse Practitioner

## 2024-09-29 ENCOUNTER — Ambulatory Visit: Payer: Self-pay | Admitting: Nurse Practitioner

## 2024-09-29 ENCOUNTER — Ambulatory Visit

## 2024-09-29 ENCOUNTER — Ambulatory Visit: Payer: Self-pay

## 2024-09-29 ENCOUNTER — Encounter: Payer: Self-pay | Admitting: Nurse Practitioner

## 2024-09-29 VITALS — BP 103/61 | HR 82 | Temp 98.2°F | Ht 76.0 in | Wt 235.0 lb

## 2024-09-29 DIAGNOSIS — M25561 Pain in right knee: Secondary | ICD-10-CM

## 2024-09-29 MED ORDER — MELOXICAM 7.5 MG PO TABS: 7.5000 mg | ORAL_TABLET | Freq: Every day | ORAL | 1 refills | Status: DC

## 2024-09-29 MED ORDER — PREDNISONE 10 MG PO TABS: 10.0000 mg | ORAL_TABLET | Freq: Every day | ORAL | 0 refills | Status: DC

## 2024-09-29 NOTE — Telephone Encounter (Signed)
 FYI Only or Action Required?: FYI only for provider: appointment scheduled on 11/6.  Patient was last seen in primary care on 09/22/2024 by [name], NP.  Called Nurse Triage reporting Knee Pain.  Symptoms began several weeks ago.  Interventions attempted: OTC medications: ibuprofen.  Symptoms are: gradually worsening.  Triage Disposition: See HCP Within 4 Hours (Or PCP Triage)  Patient/caregiver understands and will follow disposition?: Yes  Copied from CRM [id number]. Topic: Clinical - Red Word Triage >> Sep 29, 2024  8:33 AM Laurier BROCKS wrote: Red Word that prompted transfer to Nurse Triage: Patient states he has been having right knee pain for the last few days and it has now started swelling. Patient states he is unable to bare any weight on the leg without almost falling to his knees.   *Patient is requesting a call back at [phone] Reason for Disposition  [1] SEVERE pain (e.g., excruciating, unable to walk) AND [2] not improved after 2 hours of pain medicine  Answer Assessment - Initial Assessment Questions Knee brace after about a week , feels like it is locking.  Swelling in the last 2 days- right side of knee- small pocket of fluid at the bottom right side and now there is another pocket of fluid   Ibuprofen with no benefit.  Appt with pcp made.  1. LOCATION and RADIATION: Where is the pain located?      Right knee pain  2. QUALITY: What does the pain feel like?  (e.g., sharp, dull, aching, burning)     Tight and then shooting pain to the sides and back of knee.  3. SEVERITY: How bad is the pain? What does it keep you from doing?   (Scale 1-10; or mild, moderate, severe)     Constant pain 2/10, when walking 8-9/10  4. ONSET: When did the pain start? Does it come and go, or is it there all the time?     A week-week and a half 5. RECURRENT: Have you had this pain before? If Yes, ask: When, and what happened then?     denies 6. SETTING: Has  there been any recent work, exercise or other activity that involved that part of the body?      Denies any injury  7. AGGRAVATING FACTORS: What makes the knee pain worse? (e.g., walking, climbing stairs, running)     Walking  8. ASSOCIATED SYMPTOMS: Is there any swelling or redness of the knee?     Swelling, denies redness. Some warmth to the pocket of fluid. Denies knot 9. OTHER SYMPTOMS: Do you have any other symptoms? (e.g., calf pain, chest pain, difficulty breathing, fever)     denies  Protocols used: Knee Pain-A-AH

## 2024-09-29 NOTE — Progress Notes (Signed)
 Subjective:  Patient ID: James Sosa, male    DOB: 04/25/1981, 43 y.o.   MRN: [id number]  Patient Care Team: [name], NP as PCP - General (Nurse Practitioner)   Chief Complaint:  Knee Pain (Right side/X 1.5 weeks)   HPI: James Sosa is a 43 y.o. male presenting on 09/29/2024 for Knee Pain (Right side/X 1.5 weeks)   Discussed the use of AI scribe software for clinical note transcription with the patient, who gave verbal consent to proceed.  History of Present Illness James Sosa is a 43 year old male who presents with right knee pain following a fall.  He has been experiencing right knee pain that began around Monday or Tuesday last week following a fall. There was no twisting of the knee during the fall. Initially, the pain was mild, rated at a 2 or 3 out of 10, but he occasionally experienced sharp pain rated at 8 or 9 out of 10, which radiated from the outside of the knee to the tip of the bone. This sharp pain would 'stop him for a second' but never lasted more than a few seconds.  Over the past four days, the pain has worsened to the point where it almost caused his knee to buckle. Swelling has developed in the last 48 hours, with most of it occurring in the last 24 hours. He has been using a soft knee brace since Friday last week, which he continues to wear. Ibuprofen has been used for pain management, which helps with the constant pain but not the sharp pain.  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      Relevant past medical, surgical, family, and social history reviewed and updated as indicated.  Allergies and medications reviewed and updated. Data reviewed: Chart in Epic.   Past Medical History:  Diagnosis Date   Allergy    Asthma    Elevated LDL cholesterol level 12/24/2020   Vasovagal syncope     Past Surgical History:  Procedure Laterality Date   ANKLE SURGERY Left    COLONOSCOPY N/A 08/05/2024    Procedure: COLONOSCOPY;  Surgeon: Eartha Angelia Sieving, MD;  Location: [facility];  Service: Gastroenterology;  Laterality: N/A;  9:15 am, asa 1-2    Social History   Socioeconomic History   Marital status: Married    Spouse name: Not on file   Number of children: Not on file   Years of education: Not on file   Highest education level: Not on file  Occupational History   Not on file  Tobacco Use   Smoking status: Every Day    Types: E-cigarettes   Smokeless tobacco: Never  Vaping Use   Vaping status: Every Day  Substance and Sexual Activity   Alcohol use: Yes    Comment: occ   Drug use: Never   Sexual activity: Not on file  Other Topics Concern   Not on file  Social History Narrative   Not on file   Social Drivers of Health   Financial Resource Strain: Not on file  Food Insecurity: No Food Insecurity (05/19/2024)   Hunger Vital Sign    Worried About Running Out of Food in the Last Year: Never true    Ran Out of Food in the Last Year: Never true  Transportation Needs: No Transportation Needs (05/19/2024)   PRAPARE - Administrator, Civil Service (Medical):  No    Lack of Transportation (Non-Medical): No  Physical Activity: Not on file  Stress: Not on file  Social Connections: Not on file  Intimate Partner Violence: Unknown (05/19/2024)   Humiliation, Afraid, Rape, and Kick questionnaire    Fear of Current or Ex-Partner: No    Emotionally Abused: No    Physically Abused: No    Sexually Abused: Not on file    Outpatient Encounter Medications as of 09/29/2024  Medication Sig   buPROPion  (WELLBUTRIN  SR) 150 MG 12 hr tablet Take 1 tablet (150 mg total) by mouth 2 (two) times daily.   fexofenadine  (ALLEGRA  ALLERGY) 180 MG tablet Take 1 tablet (180 mg total) by mouth daily.   fluticasone  (FLONASE ) 50 MCG/ACT nasal spray Place 2 sprays into both nostrils daily.   meloxicam (MOBIC) 7.5 MG tablet Take 1 tablet (7.5 mg total) by mouth daily.   predniSONE   (DELTASONE ) 10 MG tablet Take 1 tablet (10 mg total) by mouth daily with breakfast.   No facility-administered encounter medications on file as of 09/29/2024.    No Known Allergies  Pertinent ROS per HPI, otherwise unremarkable      Objective:  BP 103/61   Pulse 82   Temp 98.2 F (36.8 C)   Ht 6' 4 (1.93 m)   Wt 235 lb (106.6 kg)   SpO2 96%   BMI 28.61 kg/m    Wt Readings from Last 3 Encounters:  09/29/24 235 lb (106.6 kg)  09/22/24 233 lb (105.7 kg)  08/05/24 230 lb (104.3 kg)    Physical Exam Vitals and nursing note reviewed.  Constitutional:      Appearance: He is overweight.  HENT:     Head: Normocephalic and atraumatic.     Nose: Nose normal.     Mouth/Throat:     Mouth: Mucous membranes are moist.  Eyes:     Extraocular Movements: Extraocular movements intact.     Conjunctiva/sclera: Conjunctivae normal.     Pupils: Pupils are equal, round, and reactive to light.  Cardiovascular:     Heart sounds: Normal heart sounds.  Pulmonary:     Effort: Pulmonary effort is normal.     Breath sounds: Normal breath sounds.  Musculoskeletal:     Right knee: Swelling present. No bony tenderness. Decreased range of motion. Normal alignment.     Instability Tests: Anterior drawer test negative.     Left knee: Normal.  Skin:    General: Skin is warm and dry.  Neurological:     Mental Status: He is alert and oriented to person, place, and time.  Psychiatric:        Mood and Affect: Mood normal.        Behavior: Behavior normal.        Thought Content: Thought content normal.        Judgment: Judgment normal.    Physical Exam MUSCULOSKELETAL: No tenderness or pain on palpation or manipulation of the right knee.     Results for orders placed or performed in visit on 08/12/24  Thyroid  Panel With TSH   Collection Time: 08/12/24 11:10 AM  Result Value Ref Range   TSH 0.398 (L) 0.450 - 4.500 uIU/mL   T4, Total 5.8 4.5 - 12.0 ug/dL   T3 Uptake Ratio 24 24 - 39 %    Free Thyroxine Index 1.4 1.2 - 4.9       Pertinent labs & imaging results that were available during my care of the patient were reviewed by me  and considered in my medical decision making.  Assessment & Plan:  [name] was seen today for knee pain.  Diagnoses and all orders for this visit:  Acute pain of right knee -     DG Knee 1-2 Views Right -     meloxicam (MOBIC) 7.5 MG tablet; Take 1 tablet (7.5 mg total) by mouth daily. -     predniSONE  (DELTASONE ) 10 MG tablet; Take 1 tablet (10 mg total) by mouth daily with breakfast.     Assessment and Plan And is a 43 year old Caucasian male seen today for right knee pain, no acute distress Assessment & Plan Right knee pain Acute right knee pain with swelling and tightness. Differential includes inflammation.  - Ordered right knee X-ray. - Prescribed steroids, one tablet twice a day for five days. - Prescribed Mobic 7.5 mg  with food. - Advised continued use of soft knee brace. - Recommended supportive shoes like Hoka. - Will reevaluate need for time off work if symptoms persist.      Continue all other maintenance medications.  Follow up plan: Return if symptoms worsen or fail to improve.   Continue healthy lifestyle choices, including diet (rich in fruits, vegetables, and lean proteins, and low in salt and simple carbohydrates) and exercise (at least 30 minutes of moderate physical activity daily).  Educational handout given for    Clinical References  Joint Pain  Joint pain can be caused by many things. It may go away if you follow instructions from your health care provider for taking care of yourself at home. Sometimes, you may need more treatment. Joint pain can be caused by: Bruises at the area of the joint. An injury caused by movements that are repeated. Wear and tear on the joint as you get older. Buildup of uric acid crystals in the joint. This is also called gout. Irritation and swelling of the joint. Types  of arthritis. Infections of the joint or of the bone. Your provider may tell you to take pain medicine or wear an elastic bandage, sling, or splint. If your joint pain continues, you may need lab or imaging tests to find the cause of your joint pain. Follow these instructions at home: If you have an elastic bandage, sling, or splint that can be taken off: Wear the bandage, sling, or splint as told by your provider. Take it off only if your provider says you can. Check the skin under and around it every day. Tell your provider if you see problems. Loosen it if your fingers or toes tingle, are numb, or turn cold and blue. Keep it clean and dry. Ask your provider if you should remove it before bathing. If the bandage, sling, or splint is not waterproof: Do not let it get wet. Cover it when you take a bath or shower. Use a cover that does not let any water in. Managing pain, stiffness, and swelling     If told, put ice on the area. If you have an elastic bandage, sling, or splint that you can take off, remove it as told. Put ice in a plastic bag. Place a towel between your skin and the bag. Leave the ice on for 20 minutes, 2-3 times a day. If told, put heat on the area. Do this as often as told. Use the heat source that your provider recommends, such as a moist heat pack or a heating pad. Place a towel between your skin and the heat source. Leave the heat on for  20-30 minutes. If your skin turns bright red, take off the ice or heat right away to prevent skin damage. The risk of damage is higher if you can't feel pain, heat, or cold. Move your fingers or toes often to reduce stiffness and swelling. Raise the injured area above the level of your heart while you're sitting or lying down. Use a pillow to support the painful area as needed. Activity Rest the painful joint as told. Do not do things that cause pain or make pain worse. Begin exercising or stretching the affected area as told by  your provider. Return to normal activities when you are told. Ask what things are safe for you to do. General instructions Take your medicines as told by your provider. Treatment may include medicines for pain and swelling that are taken by mouth or applied to the skin. Do not smoke, vape, or use products with nicotine or tobacco in them. If you need help quitting, talk with your provider. Keep all follow-up visits. Your provider will want to check on your condition. Contact a health care provider if: You have pain that does not get better with medicine. Your joint pain does not improve within 3 days. You have more bruising or swelling. You have a fever. You lose 10 lb (4.5 kg) or more without trying. Get help right away if: You cannot move the joint. Your fingers or toes tingle, become numb, or turn cold and blue. You have a fever along with a joint that's red, warm, and swollen. This information is not intended to replace advice given to you by your health care provider. Make sure you discuss any questions you have with your health care provider. Document Revised: 08/13/2023 Document Reviewed: 01/23/2023 Elsevier Patient Education  2024 Else[employer] Inc. Acute Knee Pain, Adult Many things can cause knee pain. Sometimes, knee pain is sudden (acute). It may be caused by damage, swelling, or irritation of the muscles and tissues that support your knee. Pain may come from: A fall. An injury to the knee from twisting motions. A hit to the knee. Infection. The pain often goes away on its own with time and rest. If the pain does not go away, tests may be done to find out what is causing the pain. These may include: Imaging tests, such as an X-ray, MRI, CT scan, or ultrasound. Joint aspiration. In this test, fluid is removed from the knee and checked. Arthroscopy. In this test, a lighted tube is put in the knee and an image is shown on a screen. A biopsy. In this test, a health care provider  will remove a small piece of tissue for testing. Follow these instructions at home: If you have a knee sleeve or brace that can be taken off:  Wear the knee sleeve or brace as told by your provider. Take it off only if your provider says that you can. Check the skin around it every day. Tell your provider if you see problems. Loosen the knee sleeve or brace if your toes tingle, are numb, or turn cold and blue. Keep the knee sleeve or brace clean and dry. Bathing If the knee sleeve or brace is not waterproof: Do not let it get wet. Cover it when you take a bath or shower. Use a cover that does not let any water in. Managing pain, stiffness, and swelling  If told, put ice on the area. If you have a knee sleeve or brace that you can take off, remove it as  told. Put ice in a plastic bag. Place a towel between your skin and the bag. Leave the ice on for 20 minutes, 2-3 times a day. If your skin turns bright red, take off the ice right away to prevent skin damage. The risk of damage is higher if you cannot feel pain, heat, or cold. Move your toes often to reduce stiffness and swelling. Raise the injured area above the level of your heart while you are sitting or lying down. Use a pillow to support your foot as needed. If told, use an elastic bandage to put pressure (compression) on your injured knee. This may control swelling, give support, and help with discomfort. Sleep with a pillow under your knee. Activity Rest your knee. Do not do things that cause pain or make pain worse. Do not stand or walk on your injured knee until you're told it's okay. Use crutches as told. Avoid activities where both feet leave the ground at the same time and put stress on the joints. Avoid running, jumping rope, and doing jumping jacks. Work with a physical therapist to make a safe exercise program if told. Physical therapy helps your knee move better and get stronger. Exercise as told. General  instructions Take your medicines only as told by your provider. If you are overweight, work with your provider and an expert in healthy eating, called a dietician, to set goals to lose weight. Being overweight can make your knee hurt more. Do not smoke, vape, or use products with nicotine or tobacco in them. If you need help quitting, talk with your provider. Return to normal activities when you are told. Ask what things are safe for you to do. Watch for any changes in your symptoms. Keep all follow-up visits. Your provider will check your healing and adjust treatments if needed. Contact a health care provider if: The knee pain does not stop. The knee pain changes or gets worse. You have a fever along with knee pain. Your knee is red or feels warm when you touch it. Your knee gives out or locks up. Get help right away if: Your knee swells and the swelling gets worse. You cannot move your knee. You have very bad knee pain that does not get better with medicine. This information is not intended to replace advice given to you by your health care provider. Make sure you discuss any questions you have with your health care provider. Document Revised: 08/13/2023 Document Reviewed: 01/05/2023 Elsevier Patient Education  2024 Elsevier Inc.  The above assessment and management plan was discussed with the patient. The patient verbalized understanding of and has agreed to the management plan. Patient is aware to call the clinic if they develop any new symptoms or if symptoms persist or worsen. Patient is aware when to return to the clinic for a follow-up visit. Patient educated on when it is appropriate to go to the emergency department.   Keaden Gunnoe St [place] Thompson, DNP [facility] [address] [place], [place] [address] [phone]

## 2024-09-29 NOTE — Telephone Encounter (Signed)
Patient is scheduled to see PCP today for this issue.

## 2024-10-11 ENCOUNTER — Encounter: Payer: Self-pay | Admitting: Family

## 2024-10-11 ENCOUNTER — Ambulatory Visit: Payer: Self-pay

## 2024-10-11 ENCOUNTER — Encounter: Payer: Self-pay | Admitting: Nurse Practitioner

## 2024-10-11 ENCOUNTER — Ambulatory Visit: Admitting: Family

## 2024-10-11 ENCOUNTER — Ambulatory Visit (INDEPENDENT_AMBULATORY_CARE_PROVIDER_SITE_OTHER)

## 2024-10-11 ENCOUNTER — Ambulatory Visit: Payer: Self-pay | Admitting: Family

## 2024-10-11 VITALS — BP 143/87 | HR 83 | Temp 97.7°F | Ht 76.0 in | Wt 237.4 lb

## 2024-10-11 DIAGNOSIS — M25572 Pain in left ankle and joints of left foot: Secondary | ICD-10-CM

## 2024-10-11 DIAGNOSIS — S82235A Nondisplaced oblique fracture of shaft of left tibia, initial encounter for closed fracture: Secondary | ICD-10-CM

## 2024-10-11 MED ORDER — MELOXICAM 15 MG PO TABS: 15.0000 mg | ORAL_TABLET | Freq: Every day | ORAL | 1 refills | Status: AC

## 2024-10-11 NOTE — Progress Notes (Signed)
 Subjective:    Patient ID: James Sosa, male    DOB: [birth year], 43 y.o.   MRN: [id number]  Chief Complaint  Patient presents with   Ankle Pain    AND INTO FOOT HX OF SURGERY    PT presents to the office today with left ankle pain that started this morning. Reports he has constant full aching pain of 1-2 out 10 from hx of ankle fracture with plate in 7982.   He reports he had right knee pain three weeks ago and has been limping because of his knee. He completed prednisone .   Ankle Pain  The incident occurred more than 1 week ago. The quality of the pain is described as aching. The pain is at a severity of 4/10. The pain is moderate. The pain has been Constant since onset. He reports no foreign bodies present. The symptoms are aggravated by weight bearing and movement. He has tried NSAIDs for the symptoms. The treatment provided mild relief.      Review of Systems  All other systems reviewed and are negative.   Social History   Socioeconomic History   Marital status: Married    Spouse name: Not on file   Number of children: Not on file   Years of education: Not on file   Highest education level: Not on file  Occupational History   Not on file  Tobacco Use   Smoking status: Every Day    Types: E-cigarettes   Smokeless tobacco: Never  Vaping Use   Vaping status: Every Day  Substance and Sexual Activity   Alcohol use: Yes    Comment: occ   Drug use: Never   Sexual activity: Not on file  Other Topics Concern   Not on file  Social History Narrative   Not on file   Social Drivers of Health   Financial Resource Strain: Not on file  Food Insecurity: No Food Insecurity (05/19/2024)   Hunger Vital Sign    Worried About Running Out of Food in the Last Year: Never true    Ran Out of Food in the Last Year: Never true  Transportation Needs: No Transportation Needs (05/19/2024)   PRAPARE - Administrator, Civil Service (Medical): No    Lack of Transportation  (Non-Medical): No  Physical Activity: Not on file  Stress: Not on file  Social Connections: Not on file   Family History  Problem Relation Age of Onset   Thyroid  disease Mother    Diverticulitis Mother    Heart disease Mother    Heart attack Mother    Diabetes Father    Colon cancer Maternal Grandmother        early 80s   Asthma Son    Asthma Son         Objective:   Physical Exam Vitals reviewed.  Constitutional:      General: He is not in acute distress.    Appearance: He is well-developed.  HENT:     Head: Normocephalic.  Eyes:     General:        Right eye: No discharge.        Left eye: No discharge.     Pupils: Pupils are equal, round, and reactive to light.  Neck:     Thyroid : No thyromegaly.  Cardiovascular:     Rate and Rhythm: Normal rate and regular rhythm.     Heart sounds: Normal heart sounds. No murmur heard. Pulmonary:  Effort: Pulmonary effort is normal. No respiratory distress.     Breath sounds: Normal breath sounds. No wheezing.  Abdominal:     General: Bowel sounds are normal. There is no distension.     Palpations: Abdomen is soft.     Tenderness: There is no abdominal tenderness.  Musculoskeletal:        General: Tenderness present.     Cervical back: Normal range of motion and neck supple.     Comments: Decreased ROM of left ankle with flexion and extension, pain in right knee with flexion and extension  Skin:    General: Skin is warm and dry.     Findings: No erythema or rash.  Neurological:     Mental Status: He is alert and oriented to person, place, and time.     Cranial Nerves: No cranial nerve deficit.     Deep Tendon Reflexes: Reflexes are normal and symmetric.  Psychiatric:        Behavior: Behavior normal.        Thought Content: Thought content normal.        Judgment: Judgment normal.       BP (!) 143/87   Pulse 83   Temp 97.7 F (36.5 C) (Temporal)   Ht 6' 4 (1.93 m)   Wt 237 lb 6.4 oz (107.7 kg)   BMI 28.90  kg/m      Assessment & Plan:  James Sosa comes in today with chief complaint of Ankle Pain (AND INTO FOOT HX OF SURGERY )   Diagnosis and orders addressed:  1. Acute left ankle pain (Primary) Will increase Mobic to 15 mg from 7.5 mg  No other NSAID's, but can take tylenol  Wear compression  Rest Follow up if symptoms worsen or do not improve  - meloxicam (MOBIC) 15 MG tablet; Take 1 tablet (15 mg total) by mouth daily.  Dispense: 90 tablet; Refill: 1 - DG Ankle Complete Left; Future       Bari Learn, FNP

## 2024-10-11 NOTE — Telephone Encounter (Signed)
 Pt has appt

## 2024-10-11 NOTE — Telephone Encounter (Signed)
 FYI Only or Action Required?: FYI only for provider: appointment scheduled on 10/11/2024.  Patient was last seen in primary care on 09/29/2024 by James Morton Sebastian Nena, NP.  Called Nurse Triage reporting Ankle Pain.  Symptoms began today.  Interventions attempted: Prescription medications: Prednisone  and meloxicam.  Symptoms are: gradually worsening.  Triage Disposition: See HCP Within 4 Hours (Or PCP Triage)  Patient/caregiver understands and will follow disposition?: Yes           Copied from CRM [id number]. Topic: Clinical - Red Word Triage >> Oct 11, 2024 11:05 AM James Sosa wrote: Red Word that prompted transfer to Nurse Triage: Woke up with let ankle completely locked and sore deep inside. Had surgery on this ankle with plates and screws 6 yrs ago. Also wants to follow up on rt knee that is still painful and swollen after prednisone . Reason for Disposition  [1] SEVERE pain (e.g., excruciating, unable to walk) AND [2] not improved after 2 hours of pain medicine  Answer Assessment - Initial Assessment Questions 1. ONSET: When did the pain start?      Ongoing today area is locked and sore 2. LOCATION: Where is the pain located?      L ankle  3. PAIN: How bad is the pain?  (Scale 1-10; or mild, moderate, severe)     5/10 when pressure applied  4. OTHER SYMPTOMS: Do you have any other symptoms? (e.g., calf pain, rash, fever, swelling)     R knee   Hx of ankle surgery. He states he always has pain in the ankle but symptoms worsened today.  Pt recently used meloxicam and prednisone  for symptoms.  Protocols used: Ankle Pain-A-AH

## 2024-10-11 NOTE — Patient Instructions (Signed)
Ankle Pain The ankle joint helps you stand on your leg and allows you to move around. Ankle pain can happen on either side or the back of the ankle. You may have pain in one ankle or both ankles. Ankle pain may be sharp and burning or dull and aching. There may be tenderness, stiffness, redness, or warmth around the ankle. Many things can cause ankle pain. These include an injury to the area and overuse of your ankle. Follow these instructions at home: Activity Rest your ankle as told by your health care provider. Avoid doing things that cause ankle pain. Do not use the injured limb to support your body weight until your provider says that you can. Use crutches as told by your provider. Ask your provider when it is safe to drive if you have a brace on your ankle. Do exercises as told by your provider. If you have a removable brace: Wear the brace as told by your provider. Remove it only as told by your provider. Check the skin around the brace every day. Tell your provider about any concerns. Loosen the brace if your toes tingle, become numb, or turn cold and blue. Keep the brace clean. If the brace is not waterproof: Do not let it get wet. Cover it with a watertight covering when you take a bath or shower. If you have an elastic bandage:  Remove it when you take a bath or a shower. Try not to move your ankle much. Wiggle your toes from time to time. This helps to prevent swelling. Adjust the bandage if it feels too tight. Loosen the bandage if your foot tingles, becomes numb, or turns cold and blue. Managing pain, stiffness, and swelling  If told, put ice on the painful area. If you have a removable brace or elastic bandage, remove it as told by your provider. Put ice in a plastic bag. Place a towel between your skin and the bag. Leave the ice on for 20 minutes, 2-3 times a day. If your skin turns bright red, remove the ice right away to prevent skin damage. The risk of damage is  higher if you cannot feel pain, heat, or cold. Move your toes often to reduce stiffness and swelling. Raise (elevate) your ankle above the level of your heart while you are sitting or lying down. General instructions Take over-the-counter and prescription medicines only as told by your provider. To help you and your provider, write down: How often you have ankle pain. Where the pain is. What the pain feels like. If you are told to wear a certain shoe or insole, make sure you wear it the right way and for as long as you are told. Contact a health care provider if: Your pain gets worse. Your pain does not get better with medicine. You have a fever or chills. You have more trouble walking. You have new symptoms. Your foot, leg, toes, or ankle tingles, becomes numb or swollen, or turns cold and blue. This information is not intended to replace advice given to you by your health care provider. Make sure you discuss any questions you have with your health care provider. Document Revised: 09/03/2022 Document Reviewed: 09/03/2022 Elsevier Patient Education  2024 [employer].

## 2024-10-14 ENCOUNTER — Ambulatory Visit (INDEPENDENT_AMBULATORY_CARE_PROVIDER_SITE_OTHER): Admitting: Physician Assistant

## 2024-10-14 ENCOUNTER — Encounter: Payer: Self-pay | Admitting: Physician Assistant

## 2024-10-14 DIAGNOSIS — M25572 Pain in left ankle and joints of left foot: Secondary | ICD-10-CM

## 2024-10-14 DIAGNOSIS — M25561 Pain in right knee: Secondary | ICD-10-CM | POA: Diagnosis not present

## 2024-10-14 NOTE — Progress Notes (Signed)
 Office Visit Note   Patient: James Sosa           Date of Birth: 08/22/1981           MRN: [id number] Visit Date: 10/14/2024              Requested by: Lavell Bari LABOR, FNP [address] [place],  [place] [address] PCP: [name], NP  Chief Complaint  Patient presents with   Left Ankle - Pain      HPI: 43 y/o male comes in with complaints of the left ankle locking up and right knee pain.  No known injury, but he does have history of Pilon fracture 2017/2018.  He does have decreased ankle motion over time after this injury.    He states his knee pain started after a fall at the beginning of the month.  Mobic  and knee compression sleeve were provided.    He has right knee pain that started 1 month ago.  He initially had swelling and was seen by his PCP.     increase Mobic  to 15 mg from 7.5 mg  No other NSAID's, but can take tylenol  Wear compression   Assessment & Plan: Visit Diagnoses: No diagnosis found.  Plan: He will wear the right knee sleeve and the left ankle sleeve for compression.  We discussed knee and ankle strengthening exercises.  He belongs to a gym and goes with his son.  If does not continue to improve he will call for a follow up.  I was not able to find an area of Oblique small fracture this is noted to be age indeterminate.  He has not had any new injuries and he thinks that the knee pain made him put more stress on the left ankle.  He is trending towards getting better and will do a home exercise program.  If he does not continue to improve he will call our office.   Follow-Up Instructions: Return if symptoms worsen or fail to improve.   Ortho Exam  Patient is alert, oriented, no adenopathy, well-dressed, normal affect, normal respiratory effort. The right knee ia non tender to palpation, no effusion or edema.  No crepitus on active knee flexion and extension.   Negative McMurry test.    Anterior ankle slight pain, no edema.  Active  dorsiflexion and plantar flexion do not increase pain.  Inversion and eversion are minimal without increased pain.  Standing he is able to rise up on his tip toes.  Negative PT/peroneal Tendon tenderness.     Imaging: right knee pain   FINDINGS:   BONES AND JOINTS: No acute fracture. No focal osseous lesion. No joint dislocation. Small suprapatellar joint effusion. Mild medial compartment joint space narrowing.   SOFT TISSUES: The soft tissues are unremarkable.   IMPRESSION: 1. Small suprapatellar joint effusion. 2. Mild medial compartment osteoarthritis.  Narrative & Impression  CLINICAL DATA:  Ankle pain   EXAM: LEFT ANKLE COMPLETE - 3+ VIEW   COMPARISON:  Ankle CT 06/18/2017   FINDINGS: Plate and screws fixate the distal tibia. There is no evidence for hardware loosening. Oblique small fracture line is seen along the anterior distal aspect of the tibia with intra-articular extension on the lateral view. This is age indeterminate as there is some overlying soft tissue swelling in this region. Joint spaces are well maintained.     IMPRESSION: Oblique small fracture line is seen along the anterior distal aspect of the tibia with intra-articular  extension on the lateral view. This is age indeterminate as there is some overlying soft tissue swelling in this region. Please correlate clinically.  Labs: Lab Results  Component Value Date   HGBA1C 5.4 05/18/2023     Lab Results  Component Value Date   ALBUMIN 4.7 05/19/2024   ALBUMIN 4.7 05/18/2023   ALBUMIN 4.9 12/21/2020    No results found for: MG No results found for: VD25OH  No results found for: PREALBUMIN    Latest Ref Rng & Units 05/19/2024   10:36 AM 05/18/2023   10:25 AM 12/21/2020    1:41 PM  CBC EXTENDED  WBC 3.4 - 10.8 x10E3/uL 5.9  6.2  7.2   RBC 4.14 - 5.80 x10E6/uL 4.95  5.28  5.07   Hemoglobin 13.0 - 17.7 g/dL 86.3  85.7  86.2   HCT 37.5 - 51.0 % 42.5  43.4  41.4   Platelets 150 -  450 x10E3/uL 156  153  181   NEUT# 1.4 - 7.0 x10E3/uL 3.8  3.9  4.9   Lymph# 0.7 - 3.1 x10E3/uL 1.6  1.8  1.7      There is no height or weight on file to calculate BMI.  Orders:  No orders of the defined types were placed in this encounter.  No orders of the defined types were placed in this encounter.    Procedures: No procedures performed  Clinical Data: No additional findings.  ROS:  All other systems negative, except as noted in the HPI. Review of Systems  Objective: Vital Signs: There were no vitals taken for this visit.  Specialty Comments:  No specialty comments available.  PMFS History: Patient Active Problem List   Diagnosis Date Noted   Acute pain of right knee 09/29/2024   Abnormal serum thyroid  stimulating hormone (TSH) level 05/23/2024   Special screening for malignant neoplasms, colon 05/19/2024   Pinched cervical nerve root 05/19/2024   Heat rash 05/19/2024   Allergic rhinitis 03/09/2024   Family hx of colon cancer 03/03/2024   Screening for colon cancer 03/03/2024   Family history of breast cancer in mother 03/03/2024   Encounter for smoking cessation counseling 12/03/2023   Screening PSA (prostate specific antigen) 05/18/2023   Routine medical exam 05/18/2023   Encounter to establish care 05/18/2023   Elevated LDL cholesterol level 12/24/2020   Seasonal allergies 08/04/2019   Asthma    Past Medical History:  Diagnosis Date   Allergy    Asthma    Elevated LDL cholesterol level 12/24/2020   Vasovagal syncope     Family History  Problem Relation Age of Onset   Thyroid  disease Mother    Diverticulitis Mother    Heart disease Mother    Heart attack Mother    Diabetes Father    Colon cancer Maternal Grandmother        early 52s   Asthma Son    Asthma Son     Past Surgical History:  Procedure Laterality Date   ANKLE SURGERY Left    COLONOSCOPY N/A 08/05/2024   Procedure: COLONOSCOPY;  Surgeon: Eartha Angelia Sieving, MD;   Location: [facility];  Service: Gastroenterology;  Laterality: N/A;  9:15 am, asa 1-2   Social History   Occupational History   Not on file  Tobacco Use   Smoking status: Every Day    Types: E-cigarettes   Smokeless tobacco: Never  Vaping Use   Vaping status: Every Day  Substance and Sexual Activity   Alcohol use: Yes  Comment: occ   Drug use: Never   Sexual activity: Not on file

## 2025-01-05 ENCOUNTER — Ambulatory Visit: Admitting: Nurse Practitioner
